# Patient Record
Sex: Male | Born: 1950 | Race: Black or African American | Hispanic: No | Marital: Single | Smoking: Current every day smoker
Health system: Southern US, Community
[De-identification: ages and names within clinical notes are randomized; demographics above are authoritative.]

## PROBLEM LIST (undated history)

## (undated) DIAGNOSIS — B019 Varicella without complication: Secondary | ICD-10-CM

## (undated) DIAGNOSIS — M1990 Unspecified osteoarthritis, unspecified site: Secondary | ICD-10-CM

## (undated) DIAGNOSIS — Q249 Congenital malformation of heart, unspecified: Secondary | ICD-10-CM

## (undated) DIAGNOSIS — I809 Phlebitis and thrombophlebitis of unspecified site: Secondary | ICD-10-CM

## (undated) HISTORY — DX: Unspecified osteoarthritis, unspecified site: M19.90

## (undated) HISTORY — PX: KNEE SURGERY: SHX244

## (undated) HISTORY — DX: Phlebitis and thrombophlebitis of unspecified site: I80.9

## (undated) HISTORY — DX: Congenital malformation of heart, unspecified: Q24.9

## (undated) HISTORY — DX: Varicella without complication: B01.9

---

## 2010-01-08 DIAGNOSIS — Z86718 Personal history of other venous thrombosis and embolism: Secondary | ICD-10-CM

## 2010-01-08 HISTORY — DX: Personal history of other venous thrombosis and embolism: Z86.718

## 2012-01-25 ENCOUNTER — Ambulatory Visit: Payer: Self-pay

## 2012-01-25 ENCOUNTER — Other Ambulatory Visit: Payer: Self-pay | Admitting: Occupational Medicine

## 2012-01-25 DIAGNOSIS — Z021 Encounter for pre-employment examination: Secondary | ICD-10-CM

## 2013-07-02 ENCOUNTER — Other Ambulatory Visit: Payer: Self-pay | Admitting: Family Medicine

## 2013-07-02 ENCOUNTER — Ambulatory Visit
Admission: RE | Admit: 2013-07-02 | Discharge: 2013-07-02 | Disposition: A | Discharge status: Home / Self Care | Payer: No Typology Code available for payment source | Source: Ambulatory Visit | Attending: Family Medicine | Admitting: Family Medicine

## 2013-07-02 ENCOUNTER — Encounter (INDEPENDENT_AMBULATORY_CARE_PROVIDER_SITE_OTHER): Payer: Self-pay

## 2013-07-02 DIAGNOSIS — Z0000 Encounter for general adult medical examination without abnormal findings: Secondary | ICD-10-CM

## 2014-08-18 ENCOUNTER — Other Ambulatory Visit: Payer: Self-pay | Admitting: Occupational Medicine

## 2014-08-18 ENCOUNTER — Ambulatory Visit: Payer: Self-pay

## 2014-08-18 DIAGNOSIS — Z0000 Encounter for general adult medical examination without abnormal findings: Secondary | ICD-10-CM

## 2015-11-25 ENCOUNTER — Other Ambulatory Visit: Payer: Self-pay | Admitting: Orthopedic Surgery

## 2015-11-25 DIAGNOSIS — S8001XD Contusion of right knee, subsequent encounter: Secondary | ICD-10-CM

## 2015-12-09 ENCOUNTER — Ambulatory Visit
Admission: RE | Admit: 2015-12-09 | Discharge: 2015-12-09 | Disposition: A | Discharge status: Home / Self Care | Payer: No Typology Code available for payment source | Source: Ambulatory Visit | Attending: Orthopedic Surgery | Admitting: Orthopedic Surgery

## 2015-12-09 DIAGNOSIS — S83241D Other tear of medial meniscus, current injury, right knee, subsequent encounter: Secondary | ICD-10-CM | POA: Insufficient documentation

## 2015-12-09 DIAGNOSIS — M948X6 Other specified disorders of cartilage, lower leg: Secondary | ICD-10-CM | POA: Diagnosis not present

## 2015-12-09 DIAGNOSIS — S8001XD Contusion of right knee, subsequent encounter: Secondary | ICD-10-CM | POA: Insufficient documentation

## 2015-12-09 DIAGNOSIS — M67961 Unspecified disorder of synovium and tendon, right lower leg: Secondary | ICD-10-CM | POA: Insufficient documentation

## 2015-12-09 DIAGNOSIS — X58XXXD Exposure to other specified factors, subsequent encounter: Secondary | ICD-10-CM | POA: Diagnosis not present

## 2019-06-05 ENCOUNTER — Other Ambulatory Visit: Payer: Self-pay

## 2019-06-09 ENCOUNTER — Other Ambulatory Visit: Payer: Self-pay

## 2019-06-09 ENCOUNTER — Encounter: Payer: Self-pay | Admitting: Nurse Practitioner

## 2019-06-09 ENCOUNTER — Emergency Department
Admission: EM | Admit: 2019-06-09 | Discharge: 2019-06-09 | Disposition: A | Discharge status: Home / Self Care | Payer: BC Managed Care – PPO | Attending: Emergency Medicine | Admitting: Emergency Medicine

## 2019-06-09 ENCOUNTER — Emergency Department: Payer: BC Managed Care – PPO

## 2019-06-09 ENCOUNTER — Telehealth: Payer: Self-pay | Admitting: Nurse Practitioner

## 2019-06-09 ENCOUNTER — Encounter: Payer: Self-pay | Admitting: Emergency Medicine

## 2019-06-09 ENCOUNTER — Ambulatory Visit (INDEPENDENT_AMBULATORY_CARE_PROVIDER_SITE_OTHER): Payer: BC Managed Care – PPO | Admitting: Nurse Practitioner

## 2019-06-09 VITALS — BP 160/90 | HR 64 | Temp 98.2°F | Ht 74.0 in | Wt 211.6 lb

## 2019-06-09 DIAGNOSIS — R42 Dizziness and giddiness: Secondary | ICD-10-CM

## 2019-06-09 DIAGNOSIS — I10 Essential (primary) hypertension: Secondary | ICD-10-CM

## 2019-06-09 DIAGNOSIS — R0789 Other chest pain: Secondary | ICD-10-CM | POA: Insufficient documentation

## 2019-06-09 DIAGNOSIS — H6121 Impacted cerumen, right ear: Secondary | ICD-10-CM | POA: Diagnosis not present

## 2019-06-09 DIAGNOSIS — F17210 Nicotine dependence, cigarettes, uncomplicated: Secondary | ICD-10-CM | POA: Diagnosis not present

## 2019-06-09 LAB — URINALYSIS, COMPLETE (UACMP) WITH MICROSCOPIC
Bacteria, UA: NONE SEEN
Bilirubin Urine: NEGATIVE
Glucose, UA: NEGATIVE mg/dL
Hgb urine dipstick: NEGATIVE
Ketones, ur: NEGATIVE mg/dL
Leukocytes,Ua: NEGATIVE
Nitrite: NEGATIVE
Protein, ur: NEGATIVE mg/dL
Specific Gravity, Urine: 1.021 (ref 1.005–1.030)
Squamous Epithelial / HPF: NONE SEEN (ref 0–5)
pH: 5 (ref 5.0–8.0)

## 2019-06-09 LAB — BASIC METABOLIC PANEL
Anion gap: 9 (ref 5–15)
BUN: 15 mg/dL (ref 8–23)
CO2: 24 mmol/L (ref 22–32)
Calcium: 9 mg/dL (ref 8.9–10.3)
Chloride: 109 mmol/L (ref 98–111)
Creatinine, Ser: 0.95 mg/dL (ref 0.61–1.24)
GFR calc Af Amer: >60 mL/min (ref >60)
GFR calc non Af Amer: >60 mL/min (ref >60)
Glucose, Bld: 111 mg/dL — ABNORMAL HIGH (ref 70–99)
Potassium: 3.7 mmol/L (ref 3.5–5.1)
Sodium: 142 mmol/L (ref 135–145)

## 2019-06-09 LAB — CBC
HCT: 44.8 % (ref 39.0–52.0)
Hemoglobin: 15 g/dL (ref 13.0–17.0)
MCH: 28.7 pg (ref 26.0–34.0)
MCHC: 33.5 g/dL (ref 30.0–36.0)
MCV: 85.8 fL (ref 80.0–100.0)
Platelets: 160 10*3/uL (ref 150–400)
RBC: 5.22 MIL/uL (ref 4.22–5.81)
RDW: 14.9 % (ref 11.5–15.5)
WBC: 6.1 10*3/uL (ref 4.0–10.5)
nRBC: 0 % (ref 0.0–0.2)

## 2019-06-09 LAB — TROPONIN I (HIGH SENSITIVITY): Troponin I (High Sensitivity): 8 ng/L (ref <18)

## 2019-06-09 MED ORDER — AMLODIPINE BESYLATE 5 MG PO TABS
5.0000 mg | ORAL_TABLET | Freq: Every day | ORAL | 2 refills | Status: DC
Start: 2019-06-09 — End: 2019-06-23

## 2019-06-09 NOTE — ED Notes (Signed)
E-signature not working at this time. Pt verbalized understanding of D/C instructions, prescriptions and follow up care with no further questions at this time. Pt in NAD and ambulatory at time of D/C.  

## 2019-06-09 NOTE — ED Triage Notes (Signed)
Patient reports recent dx of URI. Patient states 2 weeks ago he coughed really head and started feeling a pressure in his chest that has stayed intermittently since then. Patient also reports worsening dizziness and lightheadedness.

## 2019-06-09 NOTE — ED Notes (Signed)
Sent green and purple top tubes to lab. 

## 2019-06-09 NOTE — Progress Notes (Signed)
New Patient Office Visit  Subjective:  Patient ID: Steven Hill, male    DOB: Jan 05, 1951  Age: 69 y.o. MRN: [id number]  CC:  Chief Complaint  Patient presents with  . New Patient (Initial Visit)    establish care    HPI Steven Hill presents to establish with a primary care provider as recommended by [facility]. He has not been established with a primary care provider and has used [facility] as needed.  He was seen at [facility] for severe cough on 05/27/2019 and was prescribed Augmentin and azithromycin on 05/27/2019. CXR showed chronic chest findings.  His main concern today is feeling lightheaded, intermittent CP, and right ear  feels clogged and loss of hearing.  Lightheaded: Patient reports intermittent lightheaded sensation for about 4 years.  He says it happens during the warm weather.  It is starting to scare him now because it is becoming more frequent, and every day he has this. It can occur back to back for several minutes, and then stops.  This feels like he is going to faint. This is not a dizziness vertigo sensation.  This can occur randomly. He has had no syncope. It does not seem to be related to activity, position change, or exertion.  His right ear is clogged and he has had decreased hearing in his ear. He wonders if that could be causing his lightheadedness. He has had this sensation multiple times today. He was sitting and drinking coffee and felt faint  Chest pressure: Patient does get episodes of chest pressure that comes on randomly.  He last had this yesterday- came on while sitting, and lasted 15 seconds  Chest pain is twice daily. Non-exertional. Not associated with the lightheadedness. No  SOB except now, he feels the mask contributes to it.   He reports a history of arrhythmia/CAD:Heart arrhythmia- found on EKG's. Heart cath late 1990's- found 50-60% blockage and he did not tolerate NTG. He was advised to stop smoking and watch his diet.  DVT- 2012 following  knee surgery. He took warfarin for 1 year and no problems since.    Past Medical History:  Diagnosis Date  . Arthritis   . Cardiac arrhythmia due to congenital heart disease    Dx with arrhtymia in his 11's   . Chickenpox   . Phlebitis     Past Surgical History:  Procedure Laterality Date  . KNEE SURGERY Bilateral    Repair of meniscus right  x 2 and left knee meniscus repair.     Family History  Problem Relation Age of Onset  . Heart disease Mother   . Heart disease Father   . Heart disease Sister     Social History   Socioeconomic History  . Marital status: Single    Spouse name: Not on file  . Number of children: Not on file  . Years of education: Not on file  . Highest education level: Some college, no degree  Occupational History  . Occupation: Child psychotherapist  Tobacco Use  . Smoking status: Current Every Day Smoker    Packs/day: 1.00    Years: 50.00    Pack years: 50.00    Types: Cigarettes  . Smokeless tobacco: Never Used  Substance and Sexual Activity  . Alcohol use: Yes    Alcohol/week: 21.0 standard drinks    Types: 21 Shots of liquor per week  . Drug use: Never  . Sexual activity: Yes  Other Topics Concern  . Not  on file  Social History Narrative   Works full time. Lives alone. Divorced. Next of kin - [name] or son - [name]   Social Determinants of Health   Financial Resource Strain:   . Difficulty of Paying Living Expenses:   Food Insecurity:   . Worried About Programme researcher, broadcasting/film/video in the Last Year:   . Barista in the Last Year:   Transportation Needs:   . Freight forwarder (Medical):   Marland Kitchen Lack of Transportation (Non-Medical):   Physical Activity:   . Days of Exercise per Week:   . Minutes of Exercise per Session:   Stress:   . Feeling of Stress :   Social Connections:   . Frequency of Communication with Friends and Family:   . Frequency of Social Gatherings with Friends and Family:   . Attends Religious Services:   . Active  Member of Clubs or Organizations:   . Attends Banker Meetings:   Marland Kitchen Marital Status:   Intimate Partner Violence:   . Fear of Current or Ex-Partner:   . Emotionally Abused:   Marland Kitchen Physically Abused:   . Sexually Abused:     ROS Review of Systems  Constitutional: Negative for chills, diaphoresis, fatigue and fever.  HENT: Negative for congestion and sinus pressure.        Right ear blocked with wax and decreased hearing. He used OTC ear wax removal- no help.   Respiratory: Positive for chest tightness. Negative for cough and shortness of breath.   Cardiovascular: Positive for palpitations.  Gastrointestinal: Negative for abdominal pain, blood in stool, constipation, diarrhea, nausea and vomiting.  Endocrine: Negative.   Genitourinary: Negative for difficulty urinating.  Musculoskeletal: Positive for arthralgias.  Skin: Negative for rash.  Allergic/Immunologic: Negative.   Neurological: Positive for light-headedness.  Hematological: Negative for adenopathy. Does not bruise/bleed easily.  Psychiatric/Behavioral:       No concerns with depression/anxiety.     Objective:   Today's Vitals: BP (!) 160/90 (BP Location: Left Arm, Patient Position: Sitting, Cuff Size: Normal)   Pulse 64   Temp 98.2 F (36.8 C) (Skin)   Ht 6\' 2"  (1.88 m)   Wt 211 lb 9.6 oz (96 kg)   SpO2 98%   BMI 27.17 kg/m   Physical Exam Vitals reviewed.  Constitutional:      Appearance: Normal appearance.  HENT:     Head: Normocephalic and atraumatic.     Right Ear: There is impacted cerumen.     Left Ear: Tympanic membrane and ear canal normal. There is no impacted cerumen.  Eyes:     Pupils: Pupils are equal, round, and reactive to light.     Comments: Sclera reddened- reports chronic.   Neck:     Vascular: No carotid bruit.  Cardiovascular:     Rate and Rhythm: Normal rate and regular rhythm.     Pulses: Normal pulses.     Heart sounds: Normal heart sounds.  Pulmonary:     Effort:  Pulmonary effort is normal.     Breath sounds: Normal breath sounds. No wheezing or rhonchi.  Abdominal:     General: Abdomen is flat.     Palpations: Abdomen is soft.     Tenderness: There is no abdominal tenderness.  Musculoskeletal:        General: Normal range of motion.     Cervical back: Normal range of motion and neck supple.     Right lower leg: No edema.  Left lower leg: No edema.  Lymphadenopathy:     Cervical: No cervical adenopathy.  Skin:    General: Skin is warm and dry.  Neurological:     General: No focal deficit present.     Mental Status: He is alert and oriented to person, place, and time.     Sensory: No sensory deficit.     Motor: No weakness.     Coordination: Coordination normal.     Gait: Gait normal.     Deep Tendon Reflexes: Reflexes normal.  Psychiatric:        Mood and Affect: Mood normal.        Behavior: Behavior normal.        Thought Content: Thought content normal.        Judgment: Judgment normal.     Assessment & Plan:   Problem List Items Addressed This Visit      Cardiovascular and Mediastinum   Hypertension   Relevant Orders   EKG 12-Lead     Other   Lightheadedness - Primary   Relevant Orders   EKG 12-Lead   Chest pressure      Outpatient Encounter Medications as of 06/09/2019  Medication Sig  . meloxicam (MOBIC) 15 MG tablet Take 15 mg by mouth daily.  . [DISCONTINUED] enoxaparin (LOVENOX) 100 MG/ML injection Inject into the skin.  . [DISCONTINUED] acetaminophen (TYLENOL) 325 MG tablet Take by mouth.  . [DISCONTINUED] cyclobenzaprine (FLEXERIL) 10 MG tablet every 12 (twelve) hours.   No facility-administered encounter medications on file as of 06/09/2019.  EKG shows sinus bradycardia at 59, blood pressure 1.  T wave changes concerning for ischemia.  Case discussed with Dr. Nicki Reaper in collaboration of care.  Patient was advised to go to the emergency department for further evaluation of the EKG.  He was further advised not to  drive himself.  Patient reports there is nobody here but himself.  He declined EMS transfer.  Patient states that he accepts all responsibility for driving himself to the hospital with his current symptoms. He is aware that if he faints, he could cause an accident and injure himself or others.   Patient states he feels fine, currently has no lightheaded symptoms, no chest pressure, or shortness of breath.  Follow-up: No follow-ups on file.   Denice Paradise, NP

## 2019-06-09 NOTE — Telephone Encounter (Signed)
He was seen on the ED for HTN, abnormal EKG- with negative troponin, and lightheadedness. He had his right ear cleaned out. Hearing better. He was placed on Norvasc 5 mg daily. CBC, Bmet- WNL.   PLAN:  Office visit with me in 2 weeks to check BP.  Cardiac consult for Hx of self reported CAD, lightheadedness, and  intermittent CP with abnormal EKG.

## 2019-06-09 NOTE — ED Provider Notes (Signed)
[facility] Emergency Department Provider Note  Time seen: 2:35 PM  I have reviewed the triage vital signs and the nursing notes.   HISTORY  Chief Complaint Dizziness   HPI Steven Hill is a 69 y.o. male with a past medical history of arthritis, presents to the emergency department for intermittent dizziness and chest pain.  According to the patient approximately 2 weeks ago he developed chest pain in the left chest.  States it lasted several hours then went away.   Denies any chest pain since then but states over the past several months or years he has been experiencing intermittent episodes of dizziness and lightheadedness.  Patient says he went to urgent care to be evaluated they said due to his EKG he needs to be go see his doctor.  He went to his doctor and was told to come to the emergency department.  Patient denies any chest pain denies any dyspnea is currently but states at times he will feel lightheaded.  Patient states he has been told in the past he has high blood pressure but is not prescribed any medications for this.  Denies any weakness or numbness.  Past Medical History:  Diagnosis Date  . Arthritis   . Cardiac arrhythmia due to congenital heart disease    Dx with arrhtymia in his 64's   . Chickenpox   . Phlebitis     Patient Active Problem List   Diagnosis Date Noted  . Lightheadedness 06/09/2019  . Hypertension 06/09/2019  . Chest pressure 06/09/2019    Past Surgical History:  Procedure Laterality Date  . KNEE SURGERY Bilateral    Repair of meniscus right  x 2 and left knee meniscus repair.     Prior to Admission medications   Medication Sig Start Date End Date Taking? Authorizing Provider  meloxicam (MOBIC) 15 MG tablet Take 15 mg by mouth daily. 05/19/19   [provider]    No Known Allergies  Family History  Problem Relation Age of Onset  . Heart disease Mother   . Heart disease Father   . Heart disease Sister      Social History Social History   Tobacco Use  . Smoking status: Current Every Day Smoker    Packs/day: 1.00    Years: 50.00    Pack years: 50.00    Types: Cigarettes  . Smokeless tobacco: Never Used  Substance Use Topics  . Alcohol use: Yes    Alcohol/week: 21.0 standard drinks    Types: 21 Shots of liquor per week  . Drug use: Never    Review of Systems Constitutional: Negative for fever.  Intermittent dizziness. Cardiovascular: Chest pain 2 weeks ago when coughing but none since. Respiratory: Negative for shortness of breath. Gastrointestinal: Negative for abdominal pain, vomiting and diarrhea. Genitourinary: Negative for urinary compaints Musculoskeletal: Negative for musculoskeletal complaints Skin: Negative for skin complaints  Neurological: Negative for headache All other ROS negative  ____________________________________________   PHYSICAL EXAM:  VITAL SIGNS: ED Triage Vitals  Enc Vitals Group     BP 06/09/19 1139 (!) 171/86     Pulse Rate 06/09/19 1139 65     Resp 06/09/19 1139 18     Temp 06/09/19 1139 98.5 F (36.9 C)     Temp Source 06/09/19 1139 Oral     SpO2 06/09/19 1139 98 %     Weight 06/09/19 1145 211 lb 6.7 oz (95.9 kg)     Height 06/09/19 1145 6\' 2"  (1.88 m)  Head Circumference --      Peak Flow --      Pain Score 06/09/19 1145 0     Pain Loc --      Pain Edu? --      Excl. in GC? --     Constitutional: Alert and oriented. Well appearing and in no distress. Eyes: Normal exam ENT      Head: Normocephalic and atraumatic.  Right sided cerumen impaction.      Mouth/Throat: Mucous membranes are moist. Cardiovascular: Normal rate, regular rhythm.  Respiratory: Normal respiratory effort without tachypnea nor retractions. Breath sounds are clear  Gastrointestinal: Soft and nontender. No distention.   Musculoskeletal: Nontender with normal range of motion in all extremities.  Neurologic:  Normal speech and language. No gross focal  neurologic deficits  Skin:  Skin is warm, dry and intact.  Psychiatric: Mood and affect are normal.  ____________________________________________    EKG  EKG viewed and interpreted by myself shows a normal sinus rhythm at 68 bpm with a slightly widened QRS, normal axis, largely normal intervals, nonspecific ST changes.  Morphology most consistent with right bundle branch block.  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  No acute abnormality Chest x-ray negative  ____________________________________________   INITIAL IMPRESSION / ASSESSMENT AND PLAN / ED COURSE  Pertinent labs & imaging results that were available during my care of the patient were reviewed by me and considered in my medical decision making (see chart for details).   Patient presents to the emergency department for chest pain 2 weeks ago and intermittent dizziness over the last few months.  Overall patient appears well with a reassuring physical exam.  As a secondary complaint also states he cannot hear out of his right ear has been told he has a wax impaction per patient.  I was able to remove a good amount of cerumen with a curette, the patient states his hearing is improved dramatically as this could have been contributing to his intermittent dizziness.  Patient is also quite hypertensive has been told he has high blood pressure in the past but is not taking any medications.  Try to establish care at [facility] today but they sent him to the emergency department.  Patient will follow up with [facility].  We will start the patient on a antihypertensive medication.  I also discussed the CT findings of an old infarct, patient was unaware of this but will follow up with his primary care doctor.  Patient's work-up in the emergency department is reassuring including a negative troponin.  Steven Hill was evaluated in Emergency Department on 06/09/2019 for the  symptoms described in the history of present illness. He was evaluated in the context of the global COVID-19 pandemic, which necessitated consideration that the patient might be at risk for infection with the SARS-CoV-2 virus that causes COVID-19. Institutional protocols and algorithms that pertain to the evaluation of patients at risk for COVID-19 are in a state of rapid change based on information released by regulatory bodies including the CDC and federal and state organizations. These policies and algorithms were followed during the patient's care in the ED.  Ceruminosis is noted.  Wax is removed by manual debridement. Instructions for home care to prevent wax buildup are given.   ____________________________________________   FINAL CLINICAL IMPRESSION(S) / ED DIAGNOSES  Chest pain Hypertension Dizziness Cerumen impaction   Minna Antis, MD 06/09/19 1440

## 2019-06-10 NOTE — Telephone Encounter (Signed)
Patient scheduled for 06/22/2019 at 9:30am

## 2019-06-17 ENCOUNTER — Telehealth: Payer: Self-pay

## 2019-06-17 NOTE — Telephone Encounter (Signed)
[email or web address]

## 2019-06-17 NOTE — Telephone Encounter (Signed)
Please call him for more info.   What dates is he looking for ST disability?  He was out of work from when to when? Why was he unable to work?  Has he started back? Is he doing full duty?

## 2019-06-17 NOTE — Telephone Encounter (Signed)
Received STD forms to be filled out from [facility]. Placed in quick sign folder and will fax when finished

## 2019-06-19 ENCOUNTER — Other Ambulatory Visit: Payer: Self-pay

## 2019-06-22 ENCOUNTER — Other Ambulatory Visit: Payer: Self-pay

## 2019-06-22 ENCOUNTER — Encounter: Payer: Self-pay | Admitting: Nurse Practitioner

## 2019-06-22 ENCOUNTER — Ambulatory Visit (INDEPENDENT_AMBULATORY_CARE_PROVIDER_SITE_OTHER): Payer: BC Managed Care – PPO | Admitting: Nurse Practitioner

## 2019-06-22 VITALS — BP 170/98 | HR 72 | Temp 98.0°F | Ht 74.0 in | Wt 211.8 lb

## 2019-06-22 DIAGNOSIS — H6123 Impacted cerumen, bilateral: Secondary | ICD-10-CM

## 2019-06-22 DIAGNOSIS — I10 Essential (primary) hypertension: Secondary | ICD-10-CM | POA: Diagnosis not present

## 2019-06-22 DIAGNOSIS — R42 Dizziness and giddiness: Secondary | ICD-10-CM | POA: Diagnosis not present

## 2019-06-22 DIAGNOSIS — Z1159 Encounter for screening for other viral diseases: Secondary | ICD-10-CM

## 2019-06-22 DIAGNOSIS — R930 Abnormal findings on diagnostic imaging of skull and head, not elsewhere classified: Secondary | ICD-10-CM | POA: Diagnosis not present

## 2019-06-22 DIAGNOSIS — Z789 Other specified health status: Secondary | ICD-10-CM

## 2019-06-22 DIAGNOSIS — Z125 Encounter for screening for malignant neoplasm of prostate: Secondary | ICD-10-CM

## 2019-06-22 LAB — LIPID PANEL
Cholesterol: 168 mg/dL (ref 0–200)
HDL: 64.5 mg/dL (ref >39.00)
LDL Cholesterol: 94 mg/dL (ref 0–99)
NonHDL: 103.78
Total CHOL/HDL Ratio: 3
Triglycerides: 47 mg/dL (ref 0.0–149.0)
VLDL: 9.4 mg/dL (ref 0.0–40.0)

## 2019-06-22 LAB — MICROALBUMIN / CREATININE URINE RATIO
Creatinine,U: 107.2 mg/dL
Microalb Creat Ratio: 0.7 mg/g (ref 0.0–30.0)
Microalb, Ur: <0.7 mg/dL (ref 0.0–1.9)

## 2019-06-22 LAB — HEMOGLOBIN: Hemoglobin: 15.3 g/dL (ref 13.0–17.0)

## 2019-06-22 LAB — TSH: TSH: 0.64 u[IU]/mL (ref 0.35–4.50)

## 2019-06-22 LAB — COMPREHENSIVE METABOLIC PANEL
ALT: 12 U/L (ref 0–53)
AST: 14 U/L (ref 0–37)
Albumin: 4.4 g/dL (ref 3.5–5.2)
Alkaline Phosphatase: 76 U/L (ref 39–117)
BUN: 12 mg/dL (ref 6–23)
CO2: 25 mEq/L (ref 19–32)
Calcium: 9.2 mg/dL (ref 8.4–10.5)
Chloride: 108 mEq/L (ref 96–112)
Creatinine, Ser: 0.94 mg/dL (ref 0.40–1.50)
GFR: 96.79 mL/min (ref >60.00)
Glucose, Bld: 105 mg/dL — ABNORMAL HIGH (ref 70–99)
Potassium: 3.8 mEq/L (ref 3.5–5.1)
Sodium: 141 mEq/L (ref 135–145)
Total Bilirubin: 0.5 mg/dL (ref 0.2–1.2)
Total Protein: 6.7 g/dL (ref 6.0–8.3)

## 2019-06-22 LAB — HEMOGLOBIN A1C: Hgb A1c MFr Bld: 6.3 % (ref 4.6–6.5)

## 2019-06-22 LAB — PSA: PSA: 0.82 ng/mL (ref 0.10–4.00)

## 2019-06-22 MED ORDER — LOSARTAN POTASSIUM 50 MG PO TABS: 50.0000 mg | ORAL_TABLET | Freq: Every day | ORAL | 3 refills | Status: DC

## 2019-06-22 NOTE — Progress Notes (Signed)
Established Patient Office Visit  Subjective:  Patient ID: Steven Hill, male    DOB: Jul 28, 1950  Age: 69 y.o. MRN: [id number]  CC:  Chief Complaint  Patient presents with  . Follow-up    dizziness and hypertension    HPI Steven Hill is a 69 yo male with uncontrolled hypertension, intermittent chest pressure, dizziness/ lightheadedness, congenital heart arrhythmia and reports prior CAD 1990's, remote DVT. He returns for a 2 week f/up for dizziness and HTN.  Patient states he has been out of work since 05/26/2019 when he was seen at [facility] for cough and was prescribed Augmentin and azithromycin. He established care with [place] on 06/09/2019.  He was sent to the emergency room after seeing [place] on 06/09/2019.  Patient presents today for follow-up after ER visit.  He reports that he is waiting to be cleared to go back to work.   06/09/2019: Established care for main complaint of feeling lightheaded, having intermittent chest pressure, right ear is clogged and loss of hearing. Reports lightheaded sensation has been intermittent for 4 years, but is becoming more frequent and daily.  He feels like he is going to faint.  He does not feel like the room is spinning.  He has a sensation when he is just sitting as well as standing. BP 160/90, HR 64. EKG HR 59, ST -T wave abnormal.  He presented on no BP medication.  06/09/2019 ED visit: He had abnormal EKG with negative troponin.  He had his ear cleaned out and it was thought that would help his dizziness.  He was started on Norvasc 5 mg daily.  I ordered cardiac consultation.  06/09/2019: CT head WO contrast: No acute intracranial process.  Remote right caudate infarct. 06/09/2019: CXR 2 view: No evidence of acute cardiopulmonary abnormality.  6//14/2021:  HTN- Uncontrolled: Dx in 2012 and not on medication until 06/10/2019.  He started taking the amlodipine 5 mg daily after the ED visit.  He said the medication makes him extremely dizzy and he has to lay down for 2  hours after taking it.  Once he gets up after that, he feels okay.  He has been taking it every morning.  He does not check his blood pressure at home and is not sure if it is helping his blood pressure.  He canceled his cardiac consult.  He did not think he needed it and waited until this f/up appt to discuss.  .   BP Readings from Last 3 Encounters:  06/22/19 (!) 170/98  06/09/19 (!) 183/94  06/09/19 (!) 160/90    Lightheadedness: He continues to feel lightheaded like he might pass out.  This occurs daily this is intermittent, occurring daily, he has identified no triggers.  It can happen when he sitting, standing, and he has had no falls or syncope.  He has noted not heart palpitations, skipped beats, or chest pain.  Orthostatic vital signs obtained today are negative.  He does note that when he started amlodipine, that definitely triggered lightheadedness to the point where he had to go lay down and stay in bed for 2 hours before he was able to get back up and function.  Nicotine dependence: He is smoking 1 pack/day.  Alcohol dependence: He has cut back on his alcohol use.  He is currently having 3 shots of vodka daily.     Past Medical History:  Diagnosis Date  . Arthritis   . 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 Chickenpox   . History of DVT of lower extremity 2012   following knee surgery. Took warfarin for 1 year.   . Phlebitis     Past Surgical History:  Procedure Laterality Date  . KNEE SURGERY Bilateral    Repair of meniscus right  x 2 and left knee meniscus repair.     Family History  Problem Relation Age of Onset  . Heart disease Mother   . Heart disease Father   . Heart disease Sister     Social History   Socioeconomic History  . Marital status: Single    Spouse name: Not on file  . Number of children: Not on file  . Years of education: Not on file  . Highest education level: Some college, no degree    Occupational History  . Occupation: Diplomatic Services operational officer  Tobacco Use  . Smoking status: Current Every Day Smoker    Packs/day: 1.00    Years: 50.00    Pack years: 50.00    Types: Cigarettes  . Smokeless tobacco: Never Used  Vaping Use  . Vaping Use: Never used  Substance and Sexual Activity  . Alcohol use: Yes    Alcohol/week: 21.0 standard drinks    Types: 21 Shots of liquor per week  . Drug use: Never  . Sexual activity: Yes  Other Topics Concern  . Not on file  Social History Narrative   Works full time. Lives alone. Divorced. Next of kin - Leane Para or son - Marylyn Ishihara   Social Determinants of Health   Financial Resource Strain:   . Difficulty of Paying Living Expenses:   Food Insecurity:   . Worried About Charity fundraiser in the Last Year:   . Arboriculturist in the Last Year:   Transportation Needs:   . Film/video editor (Medical):   Marland Kitchen Lack of Transportation (Non-Medical):   Physical Activity:   . Days of Exercise per Week:   . Minutes of Exercise per Session:   Stress:   . Feeling of Stress :   Social Connections:   . Frequency of Communication with Friends and Family:   . Frequency of Social Gatherings with Friends and Family:   . Attends Religious Services:   . Active Member of Clubs or Organizations:   . Attends Archivist Meetings:   Marland Kitchen Marital Status:   Intimate Partner Violence:   . Fear of Current or Ex-Partner:   . Emotionally Abused:   Marland Kitchen Physically Abused:   . Sexually Abused:     Outpatient Medications Prior to Visit  Medication Sig Dispense Refill  . meloxicam (MOBIC) 15 MG tablet Take 15 mg by mouth daily.    Marland Kitchen amLODipine (NORVASC) 5 MG tablet Take 1 tablet (5 mg total) by mouth daily. 30 tablet 2   No facility-administered medications prior to visit.    No Known Allergies  ROS Review of Systems  Constitutional: Negative for chills, fever and unexpected weight change.  HENT: Negative for congestion and tinnitus.        Right  ear clogged.   Eyes: Negative.   Respiratory: Negative for cough, shortness of breath and stridor.   Cardiovascular: Negative for chest pain, palpitations and leg swelling.       He denies any CP now.   Gastrointestinal: Negative.   Genitourinary: Negative.   Musculoskeletal: Negative.   Allergic/Immunologic: Negative for environmental allergies.  Neurological: Positive for dizziness and light-headedness. Negative  for tremors, seizures, syncope, facial asymmetry, speech difficulty, weakness, numbness and headaches.  Hematological: Negative.   Psychiatric/Behavioral:       No concerns about depression/anxiety.       Objective:    Physical Exam Vitals reviewed.  Constitutional:      Appearance: Normal appearance.  HENT:     Head: Normocephalic and atraumatic.     Right Ear: There is impacted cerumen.     Left Ear: There is no impacted cerumen.  Eyes:     Extraocular Movements: Extraocular movements intact.     Conjunctiva/sclera: Conjunctivae normal.     Pupils: Pupils are equal, round, and reactive to light.     Comments: No nystagmus  Neck:     Vascular: No carotid bruit.  Cardiovascular:     Rate and Rhythm: Normal rate and regular rhythm.     Pulses: Normal pulses.     Heart sounds: Normal heart sounds. No murmur heard.   Pulmonary:     Effort: Pulmonary effort is normal.     Breath sounds: Normal breath sounds.  Abdominal:     Palpations: Abdomen is soft.     Tenderness: There is no abdominal tenderness.  Musculoskeletal:     Cervical back: Normal range of motion and neck supple.     Right lower leg: No edema.     Left lower leg: No edema.  Skin:    General: Skin is warm and dry.  Neurological:     General: No focal deficit present.     Mental Status: He is alert and oriented to person, place, and time. Mental status is at baseline.     Cranial Nerves: No cranial nerve deficit.     Sensory: No sensory deficit.     Motor: No weakness.     Coordination:  Coordination normal.     Gait: Gait normal.     Deep Tendon Reflexes: Reflexes normal.     Comments: No dizziness with turning head side to side or position changes unless he bends to touch his toes and comes upright.  He reports sitting in a chair he feels lightheaded.  Pulse is regular, blood pressure is systolic 016'W.   Psychiatric:        Mood and Affect: Mood normal.        Behavior: Behavior normal.        Thought Content: Thought content normal.        Judgment: Judgment normal.     BP (!) 170/98 (BP Location: Left Arm, Patient Position: Sitting, Cuff Size: Normal)   Pulse 72   Temp 98 F (36.7 C) (Skin)   Ht 6' 2"  (1.88 m)   Wt 211 lb 12.8 oz (96.1 kg)   SpO2 98%   BMI 27.19 kg/m  Wt Readings from Last 3 Encounters:  06/22/19 211 lb 12.8 oz (96.1 kg)  06/09/19 211 lb 6.7 oz (95.9 kg)  06/09/19 211 lb 9.6 oz (96 kg)     Health Maintenance Due  Topic Date Due  . TETANUS/TDAP  Never done  . COLONOSCOPY  Never done  . PNA vac Low Risk Adult (1 of 2 - PCV13) Never done    There are no preventive care reminders to display for this patient.  Lab Results  Component Value Date   TSH 0.64 06/22/2019   Lab Results  Component Value Date   WBC 6.1 06/09/2019   HGB 15.3 06/22/2019   HCT 44.8 06/09/2019   MCV 85.8 06/09/2019  PLT 160 06/09/2019   Lab Results  Component Value Date   NA 141 06/22/2019   K 3.8 06/22/2019   CO2 25 06/22/2019   GLUCOSE 105 (H) 06/22/2019   BUN 12 06/22/2019   CREATININE 0.94 06/22/2019   BILITOT 0.5 06/22/2019   ALKPHOS 76 06/22/2019   AST 14 06/22/2019   ALT 12 06/22/2019   PROT 6.7 06/22/2019   ALBUMIN 4.4 06/22/2019   CALCIUM 9.2 06/22/2019   ANIONGAP 9 06/09/2019   GFR 96.79 06/22/2019   CLINICAL DATA:  Chest pressure. Additional provided: Patient reports recent diagnosis of URI, worsening dizziness and lightheadedness, current smoker, history of congenital heart disease  EXAM: CHEST - 2 VIEW  COMPARISON:  Chest  radiograph 08/18/2014  FINDINGS: Heart size within normal limits.  There is no appreciable airspace consolidation.  No evidence of pleural effusion or pneumothorax.  No acute bony abnormality identified.  IMPRESSION: No evidence of acute cardiopulmonary abnormality.   Electronically Signed   By: Kellie Simmering DO   On: 06/09/2019 12:24  CLINICAL DATA:  Dizziness.  EXAM: CT HEAD WITHOUT CONTRAST  TECHNIQUE: Contiguous axial images were obtained from the base of the skull through the vertex without intravenous contrast.  COMPARISON:  None.  FINDINGS: Brain: Sequela of remote right caudate infarct. No acute infarct. No mass lesion. No midline shift, ventriculomegaly or extra-axial fluid collection.  Vascular: No hyperdense vessel. Bilateral carotid siphon atherosclerotic calcifications.  Skull: Negative for fracture or focal lesion.  Sinuses/Orbits: Normal orbits. Clear paranasal sinuses. No mastoid effusion.  Other: None.  IMPRESSION: No acute intracranial process.  Remote right caudate infarct.   Electronically Signed   By: Primitivo Gauze M.D.   On: 06/09/2019 12:13    Assessment & Plan:   Problem List Items Addressed This Visit      Cardiovascular and Mediastinum   Hypertension   Relevant Medications   losartan (COZAAR) 50 MG tablet   Other Relevant Orders   Comp Met (CMET) (Completed)   Lipid Profile (Completed)   HgB A1c (Completed)   TSH (Completed)   Microalbumin / creatinine urine ratio (Completed)   Ambulatory referral to Cardiology     Nervous and Auditory   Bilateral impacted cerumen   Relevant Orders   Ambulatory referral to ENT     Other   Lightheadedness   Relevant Orders   Ambulatory referral to Neurology   Hemoglobin (Completed)   Ambulatory referral to Cardiology   Abnormal CT of the head - Primary   Relevant Orders   Ambulatory referral to Neurology   Need for hepatitis C screening test    Relevant Orders   HCV Ab w Reflex to Quant PCR (Completed)   Screening for malignant neoplasm of prostate   Relevant Orders   PSA (Completed)   Daily consumption of alcohol      Meds ordered this encounter  Medications  . losartan (COZAAR) 50 MG tablet    Sig: Take 1 tablet (50 mg total) by mouth daily.    Dispense:  90 tablet    Refill:  3    Order Specific Question:   Supervising Provider    Answer:   Einar Pheasant [phone]   Please STOP the amlodipine blood pressure medicines as you do not seem to be tolerating this.   I have ordered a new blood pressure medication for you called losartan.  Take that tablet 1 daily.    We do need to have you back in the office in 2 weeks to  check your blood work to make sure that your kidney function remains normal and to check your blood pressure and to see how you are getting along.  Uncontrolled hypertension may be contributing to lightheadedness.  I have put in a referral to ENT to clean out your right ear, and slight wax in the left ear. His ear has been cleaned out several times- without success. This may be causing the lightheadedness.   I put a referral to neurology to go over the head CT findings of old stroke.  Would like neurology opinion on chronic lightheadedness as well.  Addendum: I had to replace the Cardiology referral as it was closed. I ordered an urgent referral for local Cardiologist.   The patient is requesting to remain out of work while we work-up his uncontrolled hypertension, lightheadedness, and cerumen impaction.  He does not feel safe to operate machinery as he feels lightheaded.  Physical exam in the office was unremarkable.  Orthostatic vital signs were normal.  Blood pressure is elevated and I think we can find the right blood pressure medication  for him.  He was advised to cut back on alcohol to 1 shot per day, and eventually one half shot per day.  We discussed abrupt discontinuation of alcohol when he is used to  drinking every day may put him at risk for seizure disorder.  He voices understanding and agrees.    Office visit in 2 weeks.   Follow-up: Return in about 2 weeks (around 07/06/2019).   This visit occurred during the SARS-CoV-2 public health emergency.  Safety protocols were in place, including screening questions prior to the visit, additional usage of staff PPE, and extensive cleaning of exam room while observing appropriate contact time as indicated for disinfecting solutions.   Denice Paradise, NP

## 2019-06-22 NOTE — Telephone Encounter (Signed)
Patient being seen today. Patient states he has been out of work since 05/26/19 when he was seen at [facility] for dizziness and hypertension; saw [place] on 06/09/19 and then again today. Patient was sent to ER after seeing [place] on 06/09/19 as well. Patient is waiting to be cleared to go back to work.

## 2019-06-22 NOTE — Patient Instructions (Addendum)
Please STOP the amlodipine blood pressure medicines as you do not seem to be tolerating this.   I have ordered a new blood pressure medication for you called losartan.  Take that tablet 1 daily.    We do need to have you back in the office in 2 weeks to check your blood work to make sure that your kidney function remains normal and to check your blood pressure and to see how you are getting along.  I have put in a referral to ENT to clean out your right ear, and slight wax in the left ear.  I put a referral to neurology to go over the head CT findings of old stroke.  I have already placed a referral in to cardiology for you. Please call and set up an appt.   Office visit in 2 weeks.   Dizziness Dizziness is a common problem. It is a feeling of unsteadiness or light-headedness. You may feel like you are about to faint. Dizziness can lead to injury if you stumble or fall. Anyone can become dizzy, but dizziness is more common in older adults. This condition can be caused by a number of things, including medicines, dehydration, or illness. Follow these instructions at home: Eating and drinking  Drink enough fluid to keep your urine clear or pale yellow. This helps to keep you from becoming dehydrated. Try to drink more clear fluids, such as water.  Do not drink alcohol.  Limit your caffeine intake if told to do so by your health care provider. Check ingredients and nutrition facts to see if a food or beverage contains caffeine.  Limit your salt (sodium) intake if told to do so by your health care provider. Check ingredients and nutrition facts to see if a food or beverage contains sodium. Activity  Avoid making quick movements. ? Rise slowly from chairs and steady yourself until you feel okay. ? In the morning, first sit up on the side of the bed. When you feel okay, stand slowly while you hold onto something until you know that your balance is fine.  If you need to stand in one place for  a long time, move your legs often. Tighten and relax the muscles in your legs while you are standing.  Do not drive or use heavy machinery if you feel dizzy.  Avoid bending down if you feel dizzy. Place items in your home so that they are easy for you to reach without leaning over. Lifestyle  Do not use any products that contain nicotine or tobacco, such as cigarettes and e-cigarettes. If you need help quitting, ask your health care provider.  Try to reduce your stress level by using methods such as yoga or meditation. Talk with your health care provider if you need help to manage your stress. General instructions  Watch your dizziness for any changes.  Take over-the-counter and prescription medicines only as told by your health care provider. Talk with your health care provider if you think that your dizziness is caused by a medicine that you are taking.  Tell a friend or a family member that you are feeling dizzy. If he or she notices any changes in your behavior, have this person call your health care provider.  Keep all follow-up visits as told by your health care provider. This is important. Contact a health care provider if:  Your dizziness does not go away.  Your dizziness or light-headedness gets worse.  You feel nauseous.  You have reduced hearing.  You have new symptoms.  You are unsteady on your feet or you feel like the room is spinning. Get help right away if:  You vomit or have diarrhea and are unable to eat or drink anything.  You have problems talking, walking, swallowing, or using your arms, hands, or legs.  You feel generally weak.  You are not thinking clearly or you have trouble forming sentences. It may take a friend or family member to notice this.  You have chest pain, abdominal pain, shortness of breath, or sweating.  Your vision changes.  You have any bleeding.  You have a severe headache.  You have neck pain or a stiff neck.  You have a  fever. These symptoms may represent a serious problem that is an emergency. Do not wait to see if the symptoms will go away. Get medical help right away. Call your local emergency services (911 in the [place] Do not drive yourself to the hospital. Summary  Dizziness is a feeling of unsteadiness or light-headedness. This condition can be caused by a number of things, including medicines, dehydration, or illness.  Anyone can become dizzy, but dizziness is more common in older adults.  Drink enough fluid to keep your urine clear or pale yellow. Do not drink alcohol.  Avoid making quick movements if you feel dizzy. Monitor your dizziness for any changes. This information is not intended to replace advice given to you by your health care provider. Make sure you discuss any questions you have with your health care provider. Document Revised: 12/28/2016 Document Reviewed: 01/28/2016 Elsevier Patient Education  2020 [employer]  Earwax Buildup, Adult The ears produce a substance called earwax that helps keep bacteria out of the ear and protects the skin in the ear canal. Occasionally, earwax can build up in the ear and cause discomfort or hearing loss. What increases the risk? This condition is more likely to develop in people who:  Are male.  Are elderly.  Naturally produce more earwax.  Clean their ears often with cotton swabs.  Use earplugs often.  Use in-ear headphones often.  Wear hearing aids.  Have narrow ear canals.  Have earwax that is overly thick or sticky.  Have eczema.  Are dehydrated.  Have excess hair in the ear canal. What are the signs or symptoms? Symptoms of this condition include:  Reduced or muffled hearing.  A feeling of fullness in the ear or feeling that the ear is plugged.  Fluid coming from the ear.  Ear pain.  Ear itch.  Ringing in the ear.  Coughing.  An obvious piece of earwax that can be seen inside the ear canal. How is this  diagnosed? This condition may be diagnosed based on:  Your symptoms.  Your medical history.  An ear exam. During the exam, your health care provider will look into your ear with an instrument called an otoscope. You may have tests, including a hearing test. How is this treated? This condition may be treated by:  Using ear drops to soften the earwax.  Having the earwax removed by a health care provider. The health care provider may: ? Flush the ear with water. ? Use an instrument that has a loop on the end (curette). ? Use a suction device.  Surgery to remove the wax buildup. This may be done in severe cases. Follow these instructions at home:   Take over-the-counter and prescription medicines only as told by your health care provider.  Do not put any objects,  including cotton swabs, into your ear. You can clean the opening of your ear canal with a washcloth or facial tissue.  Follow instructions from your health care provider about cleaning your ears. Do not over-clean your ears.  Drink enough fluid to keep your urine clear or pale yellow. This will help to thin the earwax.  Keep all follow-up visits as told by your health care provider. If earwax builds up in your ears often or if you use hearing aids, consider seeing your health care provider for routine, preventive ear cleanings. Ask your health care provider how often you should schedule your cleanings.  If you have hearing aids, clean them according to instructions from the manufacturer and your health care provider. Contact a health care provider if:  You have ear pain.  You develop a fever.  You have blood, pus, or other fluid coming from your ear.  You have hearing loss.  You have ringing in your ears that does not go away.  Your symptoms do not improve with treatment.  You feel like the room is spinning (vertigo). Summary  Earwax can build up in the ear and cause discomfort or hearing loss.  The most  common symptoms of this condition include reduced or muffled hearing and a feeling of fullness in the ear or feeling that the ear is plugged.  This condition may be diagnosed based on your symptoms, your medical history, and an ear exam.  This condition may be treated by using ear drops to soften the earwax or by having the earwax removed by a health care provider.  Do not put any objects, including cotton swabs, into your ear. You can clean the opening of your ear canal with a washcloth or facial tissue. This information is not intended to replace advice given to you by your health care provider. Make sure you discuss any questions you have with your health care provider. Document Revised: 12/07/2016 Document Reviewed: 03/07/2016 Elsevier Patient Education  2020 [employer].  Managing Your Hypertension Hypertension is commonly called high blood pressure. This is when the force of your blood pressing against the walls of your arteries is too strong. Arteries are blood vessels that carry blood from your heart throughout your body. Hypertension forces the heart to work harder to pump blood, and may cause the arteries to become narrow or stiff. Having untreated or uncontrolled hypertension can cause heart attack, stroke, kidney disease, and other problems. What are blood pressure readings? A blood pressure reading consists of a higher number over a lower number. Ideally, your blood pressure should be below 120/80. The first ("top") number is called the systolic pressure. It is a measure of the pressure in your arteries as your heart beats. The second ("bottom") number is called the diastolic pressure. It is a measure of the pressure in your arteries as the heart relaxes. What does my blood pressure reading mean? Blood pressure is classified into four stages. Based on your blood pressure reading, your health care provider may use the following stages to determine what type of treatment you need, if  any. Systolic pressure and diastolic pressure are measured in a unit called mm Hg. Normal  Systolic pressure: below 120.  Diastolic pressure: below 80. Elevated  Systolic pressure: 120-129.  Diastolic pressure: below 80. Hypertension stage 1  Systolic pressure: 130-139.  Diastolic pressure: 80-89. Hypertension stage 2  Systolic pressure: 140 or above.  Diastolic pressure: 90 or above. What health risks are associated with hypertension? Managing your  hypertension is an important responsibility. Uncontrolled hypertension can lead to:  A heart attack.  A stroke.  A weakened blood vessel (aneurysm).  Heart failure.  Kidney damage.  Eye damage.  Metabolic syndrome.  Memory and concentration problems. What changes can I make to manage my hypertension? Hypertension can be managed by making lifestyle changes and possibly by taking medicines. Your health care provider will help you make a plan to bring your blood pressure within a normal range. Eating and drinking   Eat a diet that is high in fiber and potassium, and low in salt (sodium), added sugar, and fat. An example eating plan is called the DASH (Dietary Approaches to Stop Hypertension) diet. To eat this way: ? Eat plenty of fresh fruits and vegetables. Try to fill half of your plate at each meal with fruits and vegetables. ? Eat whole grains, such as whole wheat pasta, brown rice, or whole grain bread. Fill about one quarter of your plate with whole grains. ? Eat low-fat diary products. ? Avoid fatty cuts of meat, processed or cured meats, and poultry with skin. Fill about one quarter of your plate with lean proteins such as fish, chicken without skin, beans, eggs, and tofu. ? Avoid premade and processed foods. These tend to be higher in sodium, added sugar, and fat.  Reduce your daily sodium intake. Most people with hypertension should eat less than 1,500 mg of sodium a day.  Limit alcohol intake to no more than 1  drink a day for nonpregnant women and 2 drinks a day for men. One drink equals 12 oz of beer, 5 oz of wine, or 1 oz of hard liquor. Lifestyle  Work with your health care provider to maintain a healthy body weight, or to lose weight. Ask what an ideal weight is for you.  Get at least 30 minutes of exercise that causes your heart to beat faster (aerobic exercise) most days of the week. Activities may include walking, swimming, or biking.  Include exercise to strengthen your muscles (resistance exercise), such as weight lifting, as part of your weekly exercise routine. Try to do these types of exercises for 30 minutes at least 3 days a week.  Do not use any products that contain nicotine or tobacco, such as cigarettes and e-cigarettes. If you need help quitting, ask your health care provider.  Control any long-term (chronic) conditions you have, such as high cholesterol or diabetes. Monitoring  Monitor your blood pressure at home as told by your health care provider. Your personal target blood pressure may vary depending on your medical conditions, your age, and other factors.  Have your blood pressure checked regularly, as often as told by your health care provider. Working with your health care provider  Review all the medicines you take with your health care provider because there may be side effects or interactions.  Talk with your health care provider about your diet, exercise habits, and other lifestyle factors that may be contributing to hypertension.  Visit your health care provider regularly. Your health care provider can help you create and adjust your plan for managing hypertension. Will I need medicine to control my blood pressure? Your health care provider may prescribe medicine if lifestyle changes are not enough to get your blood pressure under control, and if:  Your systolic blood pressure is 130 or higher.  Your diastolic blood pressure is 80 or higher. Take medicines  only as told by your health care provider. Follow the directions carefully. Blood  pressure medicines must be taken as prescribed. The medicine does not work as well when you skip doses. Skipping doses also puts you at risk for problems. Contact a health care provider if:  You think you are having a reaction to medicines you have taken.  You have repeated (recurrent) headaches.  You feel dizzy.  You have swelling in your ankles.  You have trouble with your vision. Get help right away if:  You develop a severe headache or confusion.  You have unusual weakness or numbness, or you feel faint.  You have severe pain in your chest or abdomen.  You vomit repeatedly.  You have trouble breathing. Summary  Hypertension is when the force of blood pumping through your arteries is too strong. If this condition is not controlled, it may put you at risk for serious complications.  Your personal target blood pressure may vary depending on your medical conditions, your age, and other factors. For most people, a normal blood pressure is less than 120/80.  Hypertension is managed by lifestyle changes, medicines, or both. Lifestyle changes include weight loss, eating a healthy, low-sodium diet, exercising more, and limiting alcohol. This information is not intended to replace advice given to you by your health care provider. Make sure you discuss any questions you have with your health care provider. Document Revised: 04/18/2018 Document Reviewed: 11/23/2015 Elsevier Patient Education  [place].

## 2019-06-23 ENCOUNTER — Encounter: Payer: Self-pay | Admitting: Nurse Practitioner

## 2019-06-23 DIAGNOSIS — Z125 Encounter for screening for malignant neoplasm of prostate: Secondary | ICD-10-CM | POA: Insufficient documentation

## 2019-06-23 DIAGNOSIS — Z789 Other specified health status: Secondary | ICD-10-CM | POA: Insufficient documentation

## 2019-06-23 DIAGNOSIS — H6123 Impacted cerumen, bilateral: Secondary | ICD-10-CM | POA: Insufficient documentation

## 2019-06-23 DIAGNOSIS — Z1159 Encounter for screening for other viral diseases: Secondary | ICD-10-CM | POA: Insufficient documentation

## 2019-06-23 LAB — HCV AB W REFLEX TO QUANT PCR: HCV Ab: <0.1 s/co ratio (ref 0.0–0.9)

## 2019-06-23 LAB — HCV INTERPRETATION

## 2019-06-29 ENCOUNTER — Telehealth: Payer: Self-pay | Admitting: Nurse Practitioner

## 2019-06-29 NOTE — Telephone Encounter (Signed)
There were no appts for today open; patient scheduled for tomorrow at 2:30pm

## 2019-06-29 NOTE — Telephone Encounter (Signed)
Please call him. I would like him to come in for a recheck of BP today and please place him on my afternoon schedule. If BP is down and he is still lightheaded, I will consider further imaging with carotid US and MRI. We need to discuss his short-term disability, as well.    Can you check on his Neurology referral? I do not see that he has an appt.   He has a Cardiology appt arranged for 07/10/19. It is very important for him to go to that. He cancelled my first referral.

## 2019-06-30 ENCOUNTER — Other Ambulatory Visit: Payer: Self-pay

## 2019-06-30 ENCOUNTER — Encounter: Payer: Self-pay | Admitting: Nurse Practitioner

## 2019-06-30 ENCOUNTER — Ambulatory Visit (INDEPENDENT_AMBULATORY_CARE_PROVIDER_SITE_OTHER): Payer: BC Managed Care – PPO | Admitting: Nurse Practitioner

## 2019-06-30 VITALS — BP 170/92 | HR 73 | Temp 97.4°F | Ht 74.0 in | Wt 210.0 lb

## 2019-06-30 DIAGNOSIS — H6123 Impacted cerumen, bilateral: Secondary | ICD-10-CM

## 2019-06-30 DIAGNOSIS — R7303 Prediabetes: Secondary | ICD-10-CM

## 2019-06-30 DIAGNOSIS — F17219 Nicotine dependence, cigarettes, with unspecified nicotine-induced disorders: Secondary | ICD-10-CM

## 2019-06-30 DIAGNOSIS — R42 Dizziness and giddiness: Secondary | ICD-10-CM | POA: Diagnosis not present

## 2019-06-30 DIAGNOSIS — Z6826 Body mass index (BMI) 26.0-26.9, adult: Secondary | ICD-10-CM

## 2019-06-30 DIAGNOSIS — I10 Essential (primary) hypertension: Secondary | ICD-10-CM

## 2019-06-30 DIAGNOSIS — R930 Abnormal findings on diagnostic imaging of skull and head, not elsewhere classified: Secondary | ICD-10-CM

## 2019-06-30 DIAGNOSIS — R0789 Other chest pain: Secondary | ICD-10-CM

## 2019-06-30 DIAGNOSIS — Z789 Other specified health status: Secondary | ICD-10-CM

## 2019-06-30 NOTE — Patient Instructions (Addendum)
Debrox for his ears.  Continue with current plan of care.  To the lab today.  Eye exam and referral placed to [facility]  Recheck BP in 2 weeks     Managing Your Hypertension Hypertension is commonly called high blood pressure. This is when the force of your blood pressing against the walls of your arteries is too strong. Arteries are blood vessels that carry blood from your heart throughout your body. Hypertension forces the heart to work harder to pump blood, and may cause the arteries to become narrow or stiff. Having untreated or uncontrolled hypertension can cause heart attack, stroke, kidney disease, and other problems. What are blood pressure readings? A blood pressure reading consists of a higher number over a lower number. Ideally, your blood pressure should be below 120/80. The first ("top") number is called the systolic pressure. It is a measure of the pressure in your arteries as your heart beats. The second ("bottom") number is called the diastolic pressure. It is a measure of the pressure in your arteries as the heart relaxes. What does my blood pressure reading mean? Blood pressure is classified into four stages. Based on your blood pressure reading, your health care provider may use the following stages to determine what type of treatment you need, if any. Systolic pressure and diastolic pressure are measured in a unit called mm Hg. Normal  Systolic pressure: below 120.  Diastolic pressure: below 80. Elevated  Systolic pressure: 120-129.  Diastolic pressure: below 80. Hypertension stage 1  Systolic pressure: 130-139.  Diastolic pressure: 80-89. Hypertension stage 2  Systolic pressure: 140 or above.  Diastolic pressure: 90 or above. What health risks are associated with hypertension? Managing your hypertension is an important responsibility. Uncontrolled hypertension can lead to:  A heart attack.  A stroke.  A weakened blood vessel (aneurysm).  Heart  failure.  Kidney damage.  Eye damage.  Metabolic syndrome.  Memory and concentration problems. What changes can I make to manage my hypertension? Hypertension can be managed by making lifestyle changes and possibly by taking medicines. Your health care provider will help you make a plan to bring your blood pressure within a normal range. Eating and drinking   Eat a diet that is high in fiber and potassium, and low in salt (sodium), added sugar, and fat. An example eating plan is called the DASH (Dietary Approaches to Stop Hypertension) diet. To eat this way: ? Eat plenty of fresh fruits and vegetables. Try to fill half of your plate at each meal with fruits and vegetables. ? Eat whole grains, such as whole wheat pasta, brown rice, or whole grain bread. Fill about one quarter of your plate with whole grains. ? Eat low-fat diary products. ? Avoid fatty cuts of meat, processed or cured meats, and poultry with skin. Fill about one quarter of your plate with lean proteins such as fish, chicken without skin, beans, eggs, and tofu. ? Avoid premade and processed foods. These tend to be higher in sodium, added sugar, and fat.  Reduce your daily sodium intake. Most people with hypertension should eat less than 1,500 mg of sodium a day.  Limit alcohol intake to no more than 1 drink a day for nonpregnant women and 2 drinks a day for men. One drink equals 12 oz of beer, 5 oz of wine, or 1 oz of hard liquor. Lifestyle  Work with your health care provider to maintain a healthy body weight, or to lose weight. Ask what an ideal weight  is for you.  Get at least 30 minutes of exercise that causes your heart to beat faster (aerobic exercise) most days of the week. Activities may include walking, swimming, or biking.  Include exercise to strengthen your muscles (resistance exercise), such as weight lifting, as part of your weekly exercise routine. Try to do these types of exercises for 30 minutes at least  3 days a week.  Do not use any products that contain nicotine or tobacco, such as cigarettes and e-cigarettes. If you need help quitting, ask your health care provider.  Control any long-term (chronic) conditions you have, such as high cholesterol or diabetes. Monitoring  Monitor your blood pressure at home as told by your health care provider. Your personal target blood pressure may vary depending on your medical conditions, your age, and other factors.  Have your blood pressure checked regularly, as often as told by your health care provider. Working with your health care provider  Review all the medicines you take with your health care provider because there may be side effects or interactions.  Talk with your health care provider about your diet, exercise habits, and other lifestyle factors that may be contributing to hypertension.  Visit your health care provider regularly. Your health care provider can help you create and adjust your plan for managing hypertension. Will I need medicine to control my blood pressure? Your health care provider may prescribe medicine if lifestyle changes are not enough to get your blood pressure under control, and if:  Your systolic blood pressure is 130 or higher.  Your diastolic blood pressure is 80 or higher. Take medicines only as told by your health care provider. Follow the directions carefully. Blood pressure medicines must be taken as prescribed. The medicine does not work as well when you skip doses. Skipping doses also puts you at risk for problems. Contact a health care provider if:  You think you are having a reaction to medicines you have taken.  You have repeated (recurrent) headaches.  You feel dizzy.  You have swelling in your ankles.  You have trouble with your vision. Get help right away if:  You develop a severe headache or confusion.  You have unusual weakness or numbness, or you feel faint.  You have severe pain in your  chest or abdomen.  You vomit repeatedly.  You have trouble breathing. Summary  Hypertension is when the force of blood pumping through your arteries is too strong. If this condition is not controlled, it may put you at risk for serious complications.  Your personal target blood pressure may vary depending on your medical conditions, your age, and other factors. For most people, a normal blood pressure is less than 120/80.  Hypertension is managed by lifestyle changes, medicines, or both. Lifestyle changes include weight loss, eating a healthy, low-sodium diet, exercising more, and limiting alcohol. This information is not intended to replace advice given to you by your health care provider. Make sure you discuss any questions you have with your health care provider. Document Revised: 04/18/2018 Document Reviewed: 11/23/2015 Elsevier Patient Education  2020 [employer]  Earwax Buildup, Adult The ears produce a substance called earwax that helps keep bacteria out of the ear and protects the skin in the ear canal. Occasionally, earwax can build up in the ear and cause discomfort or hearing loss. What increases the risk? This condition is more likely to develop in people who:  Are male.  Are elderly.  Naturally produce more earwax.  Clean  their ears often with cotton swabs.  Use earplugs often.  Use in-ear headphones often.  Wear hearing aids.  Have narrow ear canals.  Have earwax that is overly thick or sticky.  Have eczema.  Are dehydrated.  Have excess hair in the ear canal. What are the signs or symptoms? Symptoms of this condition include:  Reduced or muffled hearing.  A feeling of fullness in the ear or feeling that the ear is plugged.  Fluid coming from the ear.  Ear pain.  Ear itch.  Ringing in the ear.  Coughing.  An obvious piece of earwax that can be seen inside the ear canal. How is this diagnosed? This condition may be diagnosed based  on:  Your symptoms.  Your medical history.  An ear exam. During the exam, your health care provider will look into your ear with an instrument called an otoscope. You may have tests, including a hearing test. How is this treated? This condition may be treated by:  Using ear drops to soften the earwax.  Having the earwax removed by a health care provider. The health care provider may: ? Flush the ear with water. ? Use an instrument that has a loop on the end (curette). ? Use a suction device.  Surgery to remove the wax buildup. This may be done in severe cases. Follow these instructions at home:   Take over-the-counter and prescription medicines only as told by your health care provider.  Do not put any objects, including cotton swabs, into your ear. You can clean the opening of your ear canal with a washcloth or facial tissue.  Follow instructions from your health care provider about cleaning your ears. Do not over-clean your ears.  Drink enough fluid to keep your urine clear or pale yellow. This will help to thin the earwax.  Keep all follow-up visits as told by your health care provider. If earwax builds up in your ears often or if you use hearing aids, consider seeing your health care provider for routine, preventive ear cleanings. Ask your health care provider how often you should schedule your cleanings.  If you have hearing aids, clean them according to instructions from the manufacturer and your health care provider. Contact a health care provider if:  You have ear pain.  You develop a fever.  You have blood, pus, or other fluid coming from your ear.  You have hearing loss.  You have ringing in your ears that does not go away.  Your symptoms do not improve with treatment.  You feel like the room is spinning (vertigo). Summary  Earwax can build up in the ear and cause discomfort or hearing loss.  The most common symptoms of this condition include reduced or  muffled hearing and a feeling of fullness in the ear or feeling that the ear is plugged.  This condition may be diagnosed based on your symptoms, your medical history, and an ear exam.  This condition may be treated by using ear drops to soften the earwax or by having the earwax removed by a health care provider.  Do not put any objects, including cotton swabs, into your ear. You can clean the opening of your ear canal with a washcloth or facial tissue. This information is not intended to replace advice given to you by your health care provider. Make sure you discuss any questions you have with your health care provider. Document Revised: 12/07/2016 Document Reviewed: 03/07/2016 Elsevier Patient Education  2020 [employer].

## 2019-06-30 NOTE — Progress Notes (Signed)
Established Patient Office Visit  Subjective:  Patient ID: Omar Gayden, male    DOB: 1950-09-25  Age: 69 y.o. MRN: [id number]  CC:  Chief Complaint  Patient presents with  . 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  Presents for a 1 week follow-up  of uncontrolled hypertension and renal panel on new start losartan. Intermittent lightheadedness -improving.   HTN:  Presented 06/09/2019 with HTN-not on treatment.  Initial treatment with amlodipine 5 mg daily and blood pressure remained elevated and he reports feeling lightheaded.  He was started on losartan 50 mg 1 tablet daily 1 week ago. He checks his blood pressure at home with his wrist cuff and it is 148/98 -140/95- 137/96-132/81.  Office readings remain elevated and repeated by me with manual cuff.  BP Readings from Last 3 Encounters:  06/30/19 (!) 170/92  06/22/19 (!) 170/98  06/09/19 (!) 183/94   He gets out of bed in the morning he feels energetic and well rested.  He reports he takes his losartan in the morning and within 15 minutes,  it gives him a very slight feeling of lightheadedness/dizziness.   Maybe it is just anxiety, he is not sure.  Within 1-2 hours he feels fine again.  He has had the same feeling except more severe when he took amlodipine. He reports feeling so  lightheaded after taking amlodipine that everyday he had to lie down for 2 hours after taking it.    Lightheaded: Patient established care 06/09/2019 and reported an intermittent lightheaded sensation of 4 years duration.  He thinks it happens more in the warm weather.  It is becoming more frequent- every day he is experienced it.  He feels like  he is going to faint.  He has had no syncope.  He denies dizziness such as vertigo sensation.  Does not seem to be related to activity, position change or exertion.  Etiology has been unclear.   He has no chest pain pressure, heaviness, or tightness.  He has no headache.  He has noted no heart palpitations or skipped beats.  He does not get dizzy or lightheaded from moving from a supine to standing position. No falls.  Orthostatic vital signs have been negative.  Laboratory studies have been negative.  No anemia.  Negative micro albumin in the urine. He is overdue for eye exam with history of hypertension.  He has normal renal function.He has a congenital cardiac arrhythmia problem recent EKG's have not explained.  EKG 06/09/2019 showed normal sinus rhythm at 68 bpm with slightly widened QRS, normal axis, largely normal intervals, nonspecific ST changes.  Morphology not consistent with right bundle branch block.  He has an appoint with cardiology next week. CXR neg. Head CT in the ER showed an old right caudate infarct that he was unaware. He has appointment in August with neurology.  He has no neurological deficits on exam.  No carotid bruit.  He did lower his alcohol consumption down to 3 shots of liquor per day. Today, he reports drinking only one shot per day.    He has remained out of work from 05/26/19. We have not placed him out of work. He is employed at [employer] where he has  worked  for 4 years.  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  There are 12 big pots with machinery all around.  He has told us that he has not felt comfortable returning back to work with a lightheaded sensation. He was concerned about falling.  He reports today that his boss wants him to wait until he sees the specialists before he returns to work. They want a better picture of what is going with him.  He has had no falls or any fainting at home.  He continues to drive and lives alone.  Cerumen impaction: He has a  history of right  severe cerumen impaction, irrigated in our office, and cleaned out in the a ED. Still present. Lesser cerumen- but present in left ear.   He has been advised to use Debrox this week.  He has an appointment with ENT next week.  We have wondered if some of his lightheaded feeling may be inner ear/eustachian tube related.  CT of the head showed normal sinuses and no mastoid effusion.  Nicotine dependence:  Positive smoker 1 pack/day 50 pack years.  Advised to quit smoking.  Pre diabetes: BMI 26.9/ Overweight: Addressed diet and less alcohol.  Lab Results  Component Value Date   HGBA1C 6.3 06/22/2019    Past Medical History:  Diagnosis Date  . Arthritis   . Cardiac arrhythmia due to congenital heart disease    Dx with arrhtymia in his 77's   . Chickenpox   . History of DVT of lower extremity 2012   following knee surgery. Took warfarin for 1 year.   . Phlebitis     Past Surgical History:  Procedure Laterality Date  . KNEE SURGERY Bilateral    Repair of meniscus right  x 2 and left knee meniscus repair.     Family History  Problem Relation Age of Onset  . Heart disease Mother   . Heart disease Father   . Heart disease Sister     Social History   Socioeconomic History  . Marital status: Single    Spouse name: Not on file  . Number of children: Not on file  . Years of education: Not on file  . Highest education level: Some college, no degree  Occupational History  . Occupation: Child psychotherapist  Tobacco Use  . Smoking status: Current Every Day Smoker    Packs/day: 1.00    Years: 50.00    Pack years: 50.00    Types: Cigarettes  . Smokeless tobacco: Never Used  Vaping Use  . Vaping Use: Never used  Substance and Sexual Activity  . Alcohol use: Yes    Alcohol/week: 21.0 standard drinks    Types: 21 Shots of liquor per week  . Drug use: Never  . Sexual activity: Yes  Other Topics Concern  . Not on file  Social History Narrative   Works full time.  Lives alone. Divorced. Next of kin - [name] or son - [name]   Social Determinants of Health   Financial Resource Strain:   . Difficulty of Paying Living Expenses:   Food Insecurity:   . Worried About Programme researcher, broadcasting/film/video in the Last Year:   . Barista in the Last Year:   Transportation Needs:   . Freight forwarder (Medical):   Marland Kitchen Lack of Transportation (Non-Medical):   Physical Activity:   . Days of Exercise per Week:   . Minutes of Exercise per Session:   Stress:   .  Feeling of Stress :   Social Connections:   . Frequency of Communication with Friends and Family:   . Frequency of Social Gatherings with Friends and Family:   . Attends Religious Services:   . Active Member of Clubs or Organizations:   . Attends Archivist Meetings:   Marland Kitchen Marital Status:   Intimate Partner Violence:   . Fear of Current or Ex-Partner:   . Emotionally Abused:   Marland Kitchen Physically Abused:   . Sexually Abused:     Outpatient Medications Prior to Visit  Medication Sig Dispense Refill  . losartan (COZAAR) 50 MG tablet Take 1 tablet (50 mg total) by mouth daily. 90 tablet 3  . meloxicam (MOBIC) 15 MG tablet Take 15 mg by mouth daily.     No facility-administered medications prior to visit.    No Known Allergies   Review of Systems Pertinent positives noted  history of present illness otherwise negative.   Objective:    Physical Exam Vitals reviewed.  Constitutional:      Appearance: Normal appearance. He is normal weight.  HENT:     Head: Normocephalic.  Eyes:     Extraocular Movements: Extraocular movements intact.     Conjunctiva/sclera: Conjunctivae normal.     Pupils: Pupils are equal, round, and reactive to light.  Cardiovascular:     Rate and Rhythm: Normal rate and regular rhythm.     Pulses: Normal pulses.     Heart sounds: Normal heart sounds.  Pulmonary:     Effort: Pulmonary effort is normal.     Breath sounds: Normal breath sounds.  Musculoskeletal:         General: Normal range of motion.     Cervical back: Normal range of motion.  Skin:    General: Skin is warm and dry.  Neurological:     General: No focal deficit present.     Mental Status: He is alert and oriented to person, place, and time.     Cranial Nerves: No cranial nerve deficit.     Sensory: No sensory deficit.     Motor: No weakness.     Coordination: Romberg sign negative. Coordination normal. Finger-Nose-Finger Test normal.     Gait: Gait normal.     Deep Tendon Reflexes: Reflexes normal.    BP (!) 170/92 (BP Location: Left Arm, Patient Position: Sitting, Cuff Size: Normal)   Pulse 73   Temp (!) 97.4 F (36.3 C) (Skin)   Ht 6\' 2"  (1.88 m)   Wt 210 lb (95.3 kg)   SpO2 98%   BMI 26.96 kg/m  Wt Readings from Last 3 Encounters:  06/30/19 210 lb (95.3 kg)  06/22/19 211 lb 12.8 oz (96.1 kg)  06/09/19 211 lb 6.7 oz (95.9 kg)    Health Maintenance Due  Topic Date Due  . TETANUS/TDAP  Never done  . COLONOSCOPY  Never done  . PNA vac Low Risk Adult (1 of 2 - PCV13) Never done    There are no preventive care reminders to display for this patient.  Lab Results  Component Value Date   TSH 0.64 06/22/2019   Lab Results  Component Value Date   WBC 6.1 06/09/2019   HGB 15.3 06/22/2019   HCT 44.8 06/09/2019   MCV 85.8 06/09/2019   PLT 160 06/09/2019   Lab Results  Component Value Date   NA 140 06/30/2019   K 3.7 06/30/2019   CO2 26 06/30/2019   GLUCOSE 85 06/30/2019   BUN 15  06/30/2019   CREATININE 1.05 06/30/2019   BILITOT 0.5 06/22/2019   ALKPHOS 76 06/22/2019   AST 14 06/22/2019   ALT 12 06/22/2019   PROT 6.7 06/22/2019   ALBUMIN 4.4 06/22/2019   CALCIUM 9.3 06/30/2019   ANIONGAP 9 06/09/2019   GFR 85.18 06/30/2019   Lab Results  Component Value Date   CHOL 168 06/22/2019   Lab Results  Component Value Date   HDL 64.50 06/22/2019   Lab Results  Component Value Date   LDLCALC 94 06/22/2019   Lab Results  Component Value Date   TRIG 47.0  06/22/2019   Lab Results  Component Value Date   CHOLHDL 3 06/22/2019   Lab Results  Component Value Date   HGBA1C 6.3 06/22/2019   CLINICAL DATA:  Dizziness.  EXAM: CT HEAD WITHOUT CONTRAST  TECHNIQUE: Contiguous axial images were obtained from the base of the skull through the vertex without intravenous contrast.  COMPARISON:  None.  FINDINGS: Brain: Sequela of remote right caudate infarct. No acute infarct. No mass lesion. No midline shift, ventriculomegaly or extra-axial fluid collection.  Vascular: No hyperdense vessel. Bilateral carotid siphon atherosclerotic calcifications.  Skull: Negative for fracture or focal lesion.  Sinuses/Orbits: Normal orbits. Clear paranasal sinuses. No mastoid effusion.  Other: None.  IMPRESSION: No acute intracranial process.  Remote right caudate infarct.   Electronically Signed   By: Stana Buntinghikanele  Emekauwa M.D.   On: 06/09/2019 12:13 CLINICAL DATA:  Chest pressure. Additional provided: Patient reports recent diagnosis of URI, worsening dizziness and lightheadedness, current smoker, history of congenital heart disease  EXAM: CHEST - 2 VIEW  COMPARISON:  Chest radiograph 08/18/2014  FINDINGS: Heart size within normal limits.  There is no appreciable airspace consolidation.  No evidence of pleural effusion or pneumothorax.  No acute bony abnormality identified.  IMPRESSION: No evidence of acute cardiopulmonary abnormality.   Electronically Signed   By: Jackey LogeKyle  Golden DO   On: 06/09/2019 12:24   Assessment & Plan:   Problem List Items Addressed This Visit      Cardiovascular and Mediastinum   Essential hypertension - Primary    He checks his blood pressure at home with his wrist cuff and it is 148/98 -140/95- 137/96-132/81.Less lightheaded.  BP Readings from Last 3 Encounters:  06/30/19 (!) 170/92  06/22/19 (!) 170/98  06/09/19 (!) 183/94   BMet is normal on 50 mg Losartan x 1 week.  New start 06/22/2019. He has discordant blood pressure readings.  Here he remains very elevated and at home he reports improvement on losartan.  We need to have him bring his blood pressure cuff in and compare with ours.      Relevant Orders   Ambulatory referral to Ophthalmology   Basic Metabolic Panel (BMET) (Completed)     Nervous and Auditory   Bilateral impacted cerumen    Right ear has been irrigated and cleaned out by ED- still impacted. Dubrox and see ENT next week.  I would like ENT opinion on his intermittent lightheaded spells as well to see if there is some type of inner ear connection.  CT of the head showed normal sinuses.      Cigarette nicotine dependence with nicotine-induced disorder    I ppd and advised to cut back.         Other   Lightheadedness   Chest pressure   Abnormal CT of the head    I put a referral to neurology to go over the head CT findings of old  stroke.  Would like neurology opinion on chronic lightheadedness as well. No neurological deficits on exam.       Daily consumption of alcohol    He had cut back to 3 shots of liquor per day. Now, he is down to one shot per day. Feels better.       Pre-diabetes    Lab Results  Component Value Date   HGBA1C 6.3 06/22/2019   Weaning down on daily alcohol and advising healthy diet.       BMI 26.0-26.9,adult     No orders of the defined types were placed in this encounter. Debrox for his ears. ENT eval next week.  Cardiology eval next week.   He has discordant blood pressure readings.  Here he remains very elevated and at home he reports improvement on losartan.  We need to have him bring his blood pressure cuff in and compare with ours. I advised him to get upper arm blood pressure cuff as it is more accurate. Bring that into the clinic along with wrist BP to compare.   To the lab today to check Bmet on new start losartan 50 mg x 1 week.  .  Eye exam and referral placed to [facility]  Recheck BP  in 2 weeks   Follow-up: Return in about 2 weeks (around 07/14/2019).   This visit occurred during the SARS-CoV-2 public health emergency.  Safety protocols were in place, including screening questions prior to the visit, additional usage of staff PPE, and extensive cleaning of exam room while observing appropriate contact time as indicated for disinfecting solutions.   Amedeo Kinsman, NP

## 2019-07-01 DIAGNOSIS — Z6826 Body mass index (BMI) 26.0-26.9, adult: Secondary | ICD-10-CM | POA: Insufficient documentation

## 2019-07-01 DIAGNOSIS — R7303 Prediabetes: Secondary | ICD-10-CM | POA: Insufficient documentation

## 2019-07-01 DIAGNOSIS — F17219 Nicotine dependence, cigarettes, with unspecified nicotine-induced disorders: Secondary | ICD-10-CM | POA: Insufficient documentation

## 2019-07-01 LAB — BASIC METABOLIC PANEL
BUN: 15 mg/dL (ref 6–23)
CO2: 26 mEq/L (ref 19–32)
Calcium: 9.3 mg/dL (ref 8.4–10.5)
Chloride: 108 mEq/L (ref 96–112)
Creatinine, Ser: 1.05 mg/dL (ref 0.40–1.50)
GFR: 85.18 mL/min (ref >60.00)
Glucose, Bld: 85 mg/dL (ref 70–99)
Potassium: 3.7 mEq/L (ref 3.5–5.1)
Sodium: 140 mEq/L (ref 135–145)

## 2019-07-01 NOTE — Assessment & Plan Note (Signed)
Right ear has been irrigated and cleaned out by ED- still impacted. Dubrox and see ENT next week.  I would like ENT opinion on his intermittent lightheaded spells as well to see if there is some type of inner ear connection.  CT of the head showed normal sinuses.

## 2019-07-01 NOTE — Assessment & Plan Note (Signed)
He had cut back to 3 shots of liquor per day. Now, he is down to one shot per day. Feels better.

## 2019-07-01 NOTE — Assessment & Plan Note (Signed)
I ppd and advised to cut back.

## 2019-07-01 NOTE — Assessment & Plan Note (Signed)
I put a referral to neurology to go over the head CT findings of old stroke.  Would like neurology opinion on chronic lightheadedness as well. No neurological deficits on exam.

## 2019-07-01 NOTE — Assessment & Plan Note (Addendum)
He checks his blood pressure at home with his wrist cuff and it is 148/98 -140/95- 137/96-132/81.Less lightheaded.  BP Readings from Last 3 Encounters:  06/30/19 (!) 170/92  06/22/19 (!) 170/98  06/09/19 (!) 183/94   BMet is normal on 50 mg Losartan x 1 week. New start 06/22/2019. He has discordant blood pressure readings.  Here he remains very elevated and at home he reports improvement on losartan.  We need to have him bring his blood pressure cuff in and compare with ours.

## 2019-07-01 NOTE — Assessment & Plan Note (Signed)
Lab Results  Component Value Date   HGBA1C 6.3 06/22/2019   Weaning down on daily alcohol and advising healthy diet.

## 2019-07-02 ENCOUNTER — Telehealth: Payer: Self-pay | Admitting: Nurse Practitioner

## 2019-07-02 ENCOUNTER — Encounter: Payer: Self-pay | Admitting: Nurse Practitioner

## 2019-07-02 NOTE — Telephone Encounter (Signed)
We did this when patient was here for his office visit

## 2019-07-02 NOTE — Telephone Encounter (Signed)
So his wrist cuff is accurate.  And when he says BP is lower at home- then it is accurate. He may have white coat syndrome then.

## 2019-07-02 NOTE — Telephone Encounter (Signed)
Can he get a nurse visit- soon for him to bring in his BP cuff- wrist or arm- whatever he is using and we can compare with our office reading?  His BP in office his high- but he reports much better at home. He may have white coat syndrome. Thanks.

## 2019-07-02 NOTE — Telephone Encounter (Signed)
No,  he needs to see the Cardiologist as planned. His BP seems very variable.

## 2019-07-02 NOTE — Telephone Encounter (Signed)
That is correct. I asked patient when his BP is giving a good reading how is he feeling verse how hes feeling when his BP is up. Patient states that when his BP is up is when he is feeling dizzy and off and is feeling fine when BP is down. But yes we also talked about him possibly having white coat syndrome. Is there something you want me to call him about in particular today?

## 2019-07-06 ENCOUNTER — Ambulatory Visit: Payer: BC Managed Care – PPO | Admitting: Nurse Practitioner

## 2019-07-10 ENCOUNTER — Other Ambulatory Visit: Payer: Self-pay

## 2019-07-10 ENCOUNTER — Encounter: Payer: Self-pay | Admitting: Cardiology

## 2019-07-10 ENCOUNTER — Ambulatory Visit (INDEPENDENT_AMBULATORY_CARE_PROVIDER_SITE_OTHER): Payer: BC Managed Care – PPO | Admitting: Cardiology

## 2019-07-10 VITALS — BP 180/90 | HR 55 | Ht 74.0 in | Wt 215.4 lb

## 2019-07-10 DIAGNOSIS — F17200 Nicotine dependence, unspecified, uncomplicated: Secondary | ICD-10-CM

## 2019-07-10 DIAGNOSIS — F1010 Alcohol abuse, uncomplicated: Secondary | ICD-10-CM | POA: Diagnosis not present

## 2019-07-10 DIAGNOSIS — I10 Essential (primary) hypertension: Secondary | ICD-10-CM

## 2019-07-10 DIAGNOSIS — I2510 Atherosclerotic heart disease of native coronary artery without angina pectoris: Secondary | ICD-10-CM | POA: Diagnosis not present

## 2019-07-10 DIAGNOSIS — IMO0001 Reserved for inherently not codable concepts without codable children: Secondary | ICD-10-CM

## 2019-07-10 MED ORDER — ATORVASTATIN CALCIUM 20 MG PO TABS
20.0000 mg | ORAL_TABLET | Freq: Every day | ORAL | 6 refills | Status: AC
Start: 2019-07-10 — End: 2019-11-02

## 2019-07-10 MED ORDER — ATORVASTATIN CALCIUM 40 MG PO TABS
40.0000 mg | ORAL_TABLET | Freq: Every day | ORAL | 3 refills | Status: DC
Start: 2019-07-10 — End: 2019-07-10

## 2019-07-10 MED ORDER — LOSARTAN POTASSIUM 50 MG PO TABS: 50.0000 mg | ORAL_TABLET | Freq: Two times a day (BID) | ORAL | 6 refills | Status: DC

## 2019-07-10 MED ORDER — ASPIRIN EC 81 MG PO TBEC: 81.0000 mg | DELAYED_RELEASE_TABLET | Freq: Every day | ORAL | 3 refills | Status: AC

## 2019-07-10 NOTE — Progress Notes (Signed)
Cardiology Office Note:    Date:  07/10/2019   ID:  Steven Hill, DOB 1950-07-16, MRN [id number]  PCP:  Theadore Nan, NP  [facility] Cardiologist:  Debbe Odea, MD  [facility] Electrophysiologist:  None   Referring MD: Theadore Nan, NP   Chief Complaint  Patient presents with  . New Patient (Initial Visit)    Pt states having elevated BP/ lightheaded/dizziness/ chest discomfort/ Meds verbally reviewed w/ pt.   Steven Hill is a 69 y.o. male who is being seen today for the evaluation of hypertension at the request of Theadore Nan, NP.   History of Present Illness:    Steven Hill is a 69 y.o. male with a hx of hypertension, arthritis, DVT 2012 associated with knee surgery, nonobstructive CAD, current smoker x50+ years, who presents due to dizziness and elevated blood pressure.  Patient was diagnosed with hypertension in 2012.  Medical therapy was recommended.  Patient did not comply.  He recently established care 3 weeks ago and was started on losartan.  He states having dizziness for couple of minutes when he takes his losartan and then feels better after.   He had nonspecific chest discomfort in the 90s when he was in [place].  He had a left heart cath and was told he has nonobstructive CAD with about 50% stenosis.  Does not remember the name or location of cardiologist.  Does not take aspirin.  He drinks about 2 shots of vodka daily for the years now.  He currently denies chest pain or shortness of breath at rest or with exertion.  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  Past Medical History:  Diagnosis Date  . Arthritis   . Cardiac arrhythmia due to congenital heart disease    Dx with arrhtymia in his 30's   . Chickenpox   . History of DVT of lower extremity 2012   following knee surgery. Took warfarin for 1 year.   . Phlebitis     Past Surgical History:  Procedure Laterality Date  . KNEE SURGERY Bilateral     Repair of meniscus right  x 2 and left knee meniscus repair.     Current Medications: Current Meds  Medication Sig  . losartan (COZAAR) 50 MG tablet Take 1 tablet (50 mg total) by mouth in the morning and at bedtime.  . meloxicam (MOBIC) 15 MG tablet Take 15 mg by mouth daily.  . [DISCONTINUED] losartan (COZAAR) 50 MG tablet Take 1 tablet (50 mg total) by mouth daily.     Allergies:   Patient has no known allergies.   Social History   Socioeconomic History  . Marital status: Single    Spouse name: Not on file  . Number of children: Not on file  . Years of education: Not on file  . Highest education level: Some college, no degree  Occupational History  . Occupation: Child psychotherapist  Tobacco Use  . Smoking status: Current Every Day Smoker    Packs/day: 1.00    Years: 50.00    Pack years: 50.00    Types: Cigarettes  . Smokeless tobacco: Never Used  Vaping Use  . Vaping Use: Never used  Substance and Sexual Activity  . Alcohol use: Yes    Alcohol/week: 21.0 standard drinks    Types: 21 Shots of liquor per week  . Drug use: Never  . Sexual activity: Yes  Other Topics Concern  . Not on file  Social History Narrative   Works full time. Lives alone. Divorced. Next of kin - [name] or son - [name]   Social Determinants of Health   Financial Resource Strain:   . Difficulty of Paying Living Expenses:   Food Insecurity:   . Worried About Programme researcher, broadcasting/film/video in the Last Year:   . Barista in the Last Year:   Transportation Needs:   . Freight forwarder (Medical):   Marland Kitchen Lack of Transportation (Non-Medical):   Physical Activity:   . Days of Exercise per Week:   . Minutes of Exercise per Session:   Stress:   . Feeling of Stress :   Social Connections:   . Frequency of Communication with Friends and Family:   . Frequency of Social Gatherings with Friends and Family:   . Attends Religious Services:   . Active Member of Clubs or Organizations:   . Attends Tax inspector Meetings:   Marland Kitchen Marital Status:      Family History: The patient's family history includes Heart disease in his father, mother, and sister.  ROS:   Please see the history of present illness.     All other systems reviewed and are negative.  EKGs/Labs/Other Studies Reviewed:    The following studies were reviewed today:   EKG:  EKG is  ordered today.  The ekg ordered today demonstrates sinus bradycardia, heart rate 55, right bundle branch block, left anterior fascicular block.  Recent Labs: 06/09/2019: Platelets 160 06/22/2019: ALT 12; Hemoglobin 15.3; TSH 0.64 06/30/2019: BUN 15; Creatinine, Ser 1.05; Potassium 3.7; Sodium 140  Recent Lipid Panel    Component Value Date/Time   CHOL 168 06/22/2019 1025   TRIG 47.0 06/22/2019 1025   HDL 64.50 06/22/2019 1025   CHOLHDL 3 06/22/2019 1025   VLDL 9.4 06/22/2019 1025   LDLCALC 94 06/22/2019 1025    Physical Exam:    VS:  BP (!) 180/90 (BP Location: Right Arm, Patient Position: Sitting, Cuff Size: Normal)   Pulse (!) 55   Ht 6\' 2"  (1.88 m)   Wt 215 lb 6 oz (97.7 kg)   SpO2 98%   BMI 27.65 kg/m     Wt Readings from Last 3 Encounters:  07/10/19 215 lb 6 oz (97.7 kg)  06/30/19 210 lb (95.3 kg)  06/22/19 211 lb 12.8 oz (96.1 kg)     GEN:  Well nourished, well developed in no acute distress HEENT: Normal NECK: No JVD; No carotid bruits LYMPHATICS: No lymphadenopathy CARDIAC: RRR, no murmurs, rubs, gallops RESPIRATORY:  Clear to auscultation without rales, wheezing or rhonchi  ABDOMEN: Soft, non-tender, non-distended MUSCULOSKELETAL:  No edema; No deformity  SKIN: Warm and dry NEUROLOGIC:  Alert and oriented x 3 PSYCHIATRIC:  Normal affect   ASSESSMENT:    1. Coronary artery disease involving native coronary artery of native heart without angina pectoris   2. Essential hypertension   3. Smoking   4. ETOH abuse    PLAN:    In order of problems listed above:  1. Patient with reported history of  nonobstructive CAD.  He denies any symptoms of chest pain or shortness of breath at rest or with exertion.  Start aspirin 81 mg daily, recent cholesterol work week LDL 94.  Not at goal.  Start Lipitor 20 mg daily.  Get echocardiogram to evaluate systolic and diastolic function. 2. History of hypertension, blood pressure not well controlled.  Increase losartan to 50 mg twice daily.  Alcohol cessation advised  as this is contributing to elevated blood pressure readings. 3. He is a current smoker x50 years.  Smoking cessation advised.  Over 5 minutes spent counseling patient. 4. History of daily alcohol intake.  Cessation/cut back recommended.  Follow-up in 1 month.  This note was generated in part or whole with voice recognition software. Voice recognition is usually quite accurate but there are transcription errors that can and very often do occur. I apologize for any typographical errors that were not detected and corrected.  Medication Adjustments/Labs and Tests Ordered: Current medicines are reviewed at length with the patient today.  Concerns regarding medicines are outlined above.  Orders Placed This Encounter  Procedures  . EKG 12-Lead  . ECHOCARDIOGRAM COMPLETE   Meds ordered this encounter  Medications  . aspirin EC 81 MG tablet    Sig: Take 1 tablet (81 mg total) by mouth daily. Swallow whole.    Dispense:  90 tablet    Refill:  3  . DISCONTD: atorvastatin (LIPITOR) 40 MG tablet    Sig: Take 1 tablet (40 mg total) by mouth daily.    Dispense:  90 tablet    Refill:  3  . losartan (COZAAR) 50 MG tablet    Sig: Take 1 tablet (50 mg total) by mouth in the morning and at bedtime.    Dispense:  60 tablet    Refill:  6  . atorvastatin (LIPITOR) 20 MG tablet    Sig: Take 1 tablet (20 mg total) by mouth daily.    Dispense:  30 tablet    Refill:  6    Patient Instructions  Medication Instructions:   Your physician has recommended you make the following change in your medication:     1.  START taking ASA 81mg :  Take 1 tablet (81 mg total) by mouth daily. 2.  START taking Lipitor(Atorvastatin):   Take 1 tablet (20 mg total) by mouth daily. 3.  INCREASE your Cozaar (Losartan):  Take 1 tablet (50 mg total) by mouth in the morning and at bedtime.   *If you need a refill on your cardiac medications before your next appointment, please call your pharmacy*   Lab Work: None Ordered If you have labs (blood work) drawn today and your tests are completely normal, you will receive your results only by: Marland Kitchen. MyChart Message (if you have MyChart) OR . A paper copy in the mail If you have any lab test that is abnormal or we need to change your treatment, we will call you to review the results.   Testing/Procedures:  Your physician has requested that you have an echocardiogram. Echocardiography is a painless test that uses sound waves to create images of your heart. It provides your doctor with information about the size and shape of your heart and how well your heart's chambers and valves are working. This procedure takes approximately one hour. There are no restrictions for this procedure.     Follow-Up: [facility], you and your health needs are our priority.  As part of our continuing mission to provide you with exceptional heart care, we have created designated Provider Care Teams.  These Care Teams include your primary Cardiologist (physician) and Advanced Practice Providers (APPs -  Physician Assistants and Nurse Practitioners) who all work together to provide you with the care you need, when you need it.  We recommend signing up for the patient portal called "MyChart".  Sign up information is provided on this After Visit Summary.  MyChart is  used to connect with patients for Virtual Visits (Telemedicine).  Patients are able to view lab/test results, encounter notes, upcoming appointments, etc.  Non-urgent messages can be sent to your provider as well.   To learn more  about what you can do with MyChart, go to [email or web address].    Your next appointment:   4 week(s)  The format for your next appointment:   In Person  Provider:   Debbe Odea, MD   Other Instructions   Echocardiogram An echocardiogram is a procedure that uses painless sound waves (ultrasound) to produce an image of the heart. Images from an echocardiogram can provide important information about:  Signs of coronary artery disease (CAD).  Aneurysm detection. An aneurysm is a weak or damaged part of an artery wall that bulges out from the normal force of blood pumping through the body.  Heart size and shape. Changes in the size or shape of the heart can be associated with certain conditions, including heart failure, aneurysm, and CAD.  Heart muscle function.  Heart valve function.  Signs of a past heart attack.  Fluid buildup around the heart.  Thickening of the heart muscle.  A tumor or infectious growth around the heart valves. Tell a health care provider about:  Any allergies you have.  All medicines you are taking, including vitamins, herbs, eye drops, creams, and over-the-counter medicines.  Any blood disorders you have.  Any surgeries you have had.  Any medical conditions you have.  Whether you are pregnant or may be pregnant. What are the risks? Generally, this is a safe procedure. However, problems may occur, including:  Allergic reaction to dye (contrast) that may be used during the procedure. What happens before the procedure? No specific preparation is needed. You may eat and drink normally. What happens during the procedure?   An IV tube may be inserted into one of your veins.  You may receive contrast through this tube. A contrast is an injection that improves the quality of the pictures from your heart.  A gel will be applied to your chest.  A wand-like tool (transducer) will be moved over your chest. The gel will help to transmit  the sound waves from the transducer.  The sound waves will harmlessly bounce off of your heart to allow the heart images to be captured in real-time motion. The images will be recorded on a computer. The procedure may vary among health care providers and hospitals. What happens after the procedure?  You may return to your normal, everyday life, including diet, activities, and medicines, unless your health care provider tells you not to do that. Summary  An echocardiogram is a procedure that uses painless sound waves (ultrasound) to produce an image of the heart.  Images from an echocardiogram can provide important information about the size and shape of your heart, heart muscle function, heart valve function, and fluid buildup around your heart.  You do not need to do anything to prepare before this procedure. You may eat and drink normally.  After the echocardiogram is completed, you may return to your normal, everyday life, unless your health care provider tells you not to do that. This information is not intended to replace advice given to you by your health care provider. Make sure you discuss any questions you have with your health care provider. Document Revised: 04/17/2018 Document Reviewed: 01/28/2016 Elsevier Patient Education  2020 Elsevier Inc.  Atorvastatin tablets What is this medicine? ATORVASTATIN (a TORE va sta tin) is known  as a HMG-CoA reductase inhibitor or 'statin'. It lowers the level of cholesterol and triglycerides in the blood. This drug may also reduce the risk of heart attack, stroke, or other health problems in patients with risk factors for heart disease. Diet and lifestyle changes are often used with this drug. This medicine may be used for other purposes; ask your health care provider or pharmacist if you have questions. COMMON BRAND NAME(S): Lipitor What should I tell my health care provider before I take this medicine? They need to know if you have any of  these conditions:  diabetes  if you often drink alcohol  history of stroke  kidney disease  liver disease  muscle aches or weakness  thyroid disease  an unusual or allergic reaction to atorvastatin, other medicines, foods, dyes, or preservatives  pregnant or trying to get pregnant  breast-feeding How should I use this medicine? Take this medicine by mouth with a glass of water. Follow the directions on the prescription label. You can take it with or without food. If it upsets your stomach, take it with food. Do not take with grapefruit juice. Take your medicine at regular intervals. Do not take it more often than directed. Do not stop taking except on your doctor's advice. Talk to your pediatrician regarding the use of this medicine in children. While this drug may be prescribed for children as young as 10 for selected conditions, precautions do apply. Overdosage: If you think you have taken too much of this medicine contact a poison control center or emergency room at once. NOTE: This medicine is only for you. Do not share this medicine with others. What if I miss a dose? If you miss a dose, take it as soon as you can. If your next dose is to be taken in less than 12 hours, then do not take the missed dose. Take the next dose at your regular time. Do not take double or extra doses. What may interact with this medicine? Do not take this medicine with any of the following medications:  dasabuvir; ombitasvir; paritaprevir; ritonavir  ombitasvir; paritaprevir; ritonavir  posaconazole  red yeast rice This medicine may also interact with the following medications:  alcohol  birth control pills  certain antibiotics like erythromycin and clarithromycin  certain antivirals for HIV or hepatitis  certain medicines for cholesterol like fenofibrate, gemfibrozil, and niacin  certain medicines for fungal infections like ketoconazole and  itraconazole  colchicine  cyclosporine  digoxin  grapefruit juice  rifampin This list may not describe all possible interactions. Give your health care provider a list of all the medicines, herbs, non-prescription drugs, or dietary supplements you use. Also tell them if you smoke, drink alcohol, or use illegal drugs. Some items may interact with your medicine. What should I watch for while using this medicine? Visit your doctor or health care professional for regular check-ups. You may need regular tests to make sure your liver is working properly. Your health care professional may tell you to stop taking this medicine if you develop muscle problems. If your muscle problems do not go away after stopping this medicine, contact your health care professional. Do not become pregnant while taking this medicine. Women should inform their health care professional if they wish to become pregnant or think they might be pregnant. There is a potential for serious side effects to an unborn child. Talk to your health care professional or pharmacist for more information. Do not breast-feed an infant while taking this medicine.  This medicine may increase blood sugar. Ask your healthcare provider if changes in diet or medicines are needed if you have diabetes. If you are going to need surgery or other procedure, tell your doctor that you are using this medicine. This drug is only part of a total heart-health program. Your doctor or a dietician can suggest a low-cholesterol and low-fat diet to help. Avoid alcohol and smoking, and keep a proper exercise schedule. This medicine may cause a decrease in Co-Enzyme Q-10. You should make sure that you get enough Co-Enzyme Q-10 while you are taking this medicine. Discuss the foods you eat and the vitamins you take with your health care professional. What side effects may I notice from receiving this medicine? Side effects that you should report to your doctor or health  care professional as soon as possible:  allergic reactions like skin rash, itching or hives, swelling of the face, lips, or tongue  fever  joint pain  loss of memory  redness, blistering, peeling or loosening of the skin, including inside the mouth  signs and symptoms of high blood sugar such as being more thirsty or hungry or having to urinate more than normal. You may also feel very tired or have blurry vision.  signs and symptoms of liver injury like dark yellow or brown urine; general ill feeling or flu-like symptoms; light-belly pain; unusually weak or tired; yellowing of the eyes or skin  signs and symptoms of muscle injury like dark urine; trouble passing urine or change in the amount of urine; unusually weak or tired; muscle pain or side or back pain Side effects that usually do not require medical attention (report to your doctor or health care professional if they continue or are bothersome):  diarrhea  nausea  stomach pain  trouble sleeping  upset stomach This list may not describe all possible side effects. Call your doctor for medical advice about side effects. You may report side effects to FDA at 1-800-FDA-1088. Where should I keep my medicine? Keep out of the reach of children. Store between 20 and 25 degrees C (68 and 77 degrees F). Throw away any unused medicine after the expiration date. NOTE: This sheet is a summary. It may not cover all possible information. If you have questions about this medicine, talk to your doctor, pharmacist, or health care provider.  2020 Elsevier/Gold Standard (2017-10-16 11:36:16)       Signed, Debbe Odea, MD  07/10/2019 12:32 PM     [facility]

## 2019-07-10 NOTE — Patient Instructions (Signed)
Medication Instructions:   Your physician has recommended you make the following change in your medication:   1.  START taking ASA 81mg :  Take 1 tablet (81 mg total) by mouth daily. 2.  START taking Lipitor(Atorvastatin):   Take 1 tablet (20 mg total) by mouth daily. 3.  INCREASE your Cozaar (Losartan):  Take 1 tablet (50 mg total) by mouth in the morning and at bedtime.   *If you need a refill on your cardiac medications before your next appointment, please call your pharmacy*   Lab Work: None Ordered If you have labs (blood work) drawn today and your tests are completely normal, you will receive your results only by: MyChart Message (if you have MyChart) OR . A paper copy in the mail If you have any lab test that is abnormal or we need to change your treatment, we will call you to review the results.   Testing/Procedures:  Your physician has requested that you have an echocardiogram. Echocardiography is a painless test that uses sound waves to create images of your heart. It provides your doctor with information about the size and shape of your heart and how well your heart's chambers and valves are working. This procedure takes approximately one hour. There are no restrictions for this procedure.     Follow-Up: At [facility], you and your health needs are our priority.  As part of our continuing mission to provide you with exceptional heart care, we have created designated Provider Care Teams.  These Care Teams include your primary Cardiologist (physician) and Advanced Practice Providers (APPs -  Physician Assistants and Nurse Practitioners) who all work together to provide you with the care you need, when you need it.  We recommend signing up for the patient portal called "MyChart".  Sign up information is provided on this After Visit Summary.  MyChart is used to connect with patients for Virtual Visits (Telemedicine).  Patients are able to view lab/test results, encounter  notes, upcoming appointments, etc.  Non-urgent messages can be sent to your provider as well.   To learn more about what you can do with MyChart, go to [facility].    Your next appointment:   4 week(s)  The format for your next appointment:   In Person  Provider:   [email or web address], MD   Other Instructions   Echocardiogram An echocardiogram is a procedure that uses painless sound waves (ultrasound) to produce an image of the heart. Images from an echocardiogram can provide important information about:  Signs of coronary artery disease (CAD).  Aneurysm detection. An aneurysm is a weak or damaged part of an artery wall that bulges out from the normal force of blood pumping through the body.  Heart size and shape. Changes in the size or shape of the heart can be associated with certain conditions, including heart failure, aneurysm, and CAD.  Heart muscle function.  Heart valve function.  Signs of a past heart attack.  Fluid buildup around the heart.  Thickening of the heart muscle.  A tumor or infectious growth around the heart valves. Tell a health care provider about:  Any allergies you have.  All medicines you are taking, including vitamins, herbs, eye drops, creams, and over-the-counter medicines.  Any blood disorders you have.  Any surgeries you have had.  Any medical conditions you have.  Whether you are pregnant or may be pregnant. What are the risks? Generally, this is a safe procedure. However, problems may occur, including:  Allergic reaction  to dye (contrast) that may be used during the procedure. What happens before the procedure? No specific preparation is needed. You may eat and drink normally. What happens during the procedure?   An IV tube may be inserted into one of your veins.  You may receive contrast through this tube. A contrast is an injection that improves the quality of the pictures from your heart.  A gel will be  applied to your chest.  A wand-like tool (transducer) will be moved over your chest. The gel will help to transmit the sound waves from the transducer.  The sound waves will harmlessly bounce off of your heart to allow the heart images to be captured in real-time motion. The images will be recorded on a computer. The procedure may vary among health care providers and hospitals. What happens after the procedure?  You may return to your normal, everyday life, including diet, activities, and medicines, unless your health care provider tells you not to do that. Summary  An echocardiogram is a procedure that uses painless sound waves (ultrasound) to produce an image of the heart.  Images from an echocardiogram can provide important information about the size and shape of your heart, heart muscle function, heart valve function, and fluid buildup around your heart.  You do not need to do anything to prepare before this procedure. You may eat and drink normally.  After the echocardiogram is completed, you may return to your normal, everyday life, unless your health care provider tells you not to do that. This information is not intended to replace advice given to you by your health care provider. Make sure you discuss any questions you have with your health care provider. Document Revised: 04/17/2018 Document Reviewed: 01/28/2016 Elsevier Patient Education  2020 Elsevier Inc.  Atorvastatin tablets What is this medicine? ATORVASTATIN (a TORE va sta tin) is known as a HMG-CoA reductase inhibitor or 'statin'. It lowers the level of cholesterol and triglycerides in the blood. This drug may also reduce the risk of heart attack, stroke, or other health problems in patients with risk factors for heart disease. Diet and lifestyle changes are often used with this drug. This medicine may be used for other purposes; ask your health care provider or pharmacist if you have questions. COMMON BRAND NAME(S):  Lipitor What should I tell my health care provider before I take this medicine? They need to know if you have any of these conditions:  diabetes  if you often drink alcohol  history of stroke  kidney disease  liver disease  muscle aches or weakness  thyroid disease  an unusual or allergic reaction to atorvastatin, other medicines, foods, dyes, or preservatives  pregnant or trying to get pregnant  breast-feeding How should I use this medicine? Take this medicine by mouth with a glass of water. Follow the directions on the prescription label. You can take it with or without food. If it upsets your stomach, take it with food. Do not take with grapefruit juice. Take your medicine at regular intervals. Do not take it more often than directed. Do not stop taking except on your doctor's advice. Talk to your pediatrician regarding the use of this medicine in children. While this drug may be prescribed for children as young as 10 for selected conditions, precautions do apply. Overdosage: If you think you have taken too much of this medicine contact a poison control center or emergency room at once. NOTE: This medicine is only for you. Do not share this  medicine with others. What if I miss a dose? If you miss a dose, take it as soon as you can. If your next dose is to be taken in less than 12 hours, then do not take the missed dose. Take the next dose at your regular time. Do not take double or extra doses. What may interact with this medicine? Do not take this medicine with any of the following medications:  dasabuvir; ombitasvir; paritaprevir; ritonavir  ombitasvir; paritaprevir; ritonavir  posaconazole  red yeast rice This medicine may also interact with the following medications:  alcohol  birth control pills  certain antibiotics like erythromycin and clarithromycin  certain antivirals for HIV or hepatitis  certain medicines for cholesterol like fenofibrate, gemfibrozil,  and niacin  certain medicines for fungal infections like ketoconazole and itraconazole  colchicine  cyclosporine  digoxin  grapefruit juice  rifampin This list may not describe all possible interactions. Give your health care provider a list of all the medicines, herbs, non-prescription drugs, or dietary supplements you use. Also tell them if you smoke, drink alcohol, or use illegal drugs. Some items may interact with your medicine. What should I watch for while using this medicine? Visit your doctor or health care professional for regular check-ups. You may need regular tests to make sure your liver is working properly. Your health care professional may tell you to stop taking this medicine if you develop muscle problems. If your muscle problems do not go away after stopping this medicine, contact your health care professional. Do not become pregnant while taking this medicine. Women should inform their health care professional if they wish to become pregnant or think they might be pregnant. There is a potential for serious side effects to an unborn child. Talk to your health care professional or pharmacist for more information. Do not breast-feed an infant while taking this medicine. This medicine may increase blood sugar. Ask your healthcare provider if changes in diet or medicines are needed if you have diabetes. If you are going to need surgery or other procedure, tell your doctor that you are using this medicine. This drug is only part of a total heart-health program. Your doctor or a dietician can suggest a low-cholesterol and low-fat diet to help. Avoid alcohol and smoking, and keep a proper exercise schedule. This medicine may cause a decrease in Co-Enzyme Q-10. You should make sure that you get enough Co-Enzyme Q-10 while you are taking this medicine. Discuss the foods you eat and the vitamins you take with your health care professional. What side effects may I notice from receiving  this medicine? Side effects that you should report to your doctor or health care professional as soon as possible:  allergic reactions like skin rash, itching or hives, swelling of the face, lips, or tongue  fever  joint pain  loss of memory  redness, blistering, peeling or loosening of the skin, including inside the mouth  signs and symptoms of high blood sugar such as being more thirsty or hungry or having to urinate more than normal. You may also feel very tired or have blurry vision.  signs and symptoms of liver injury like dark yellow or brown urine; general ill feeling or flu-like symptoms; light-belly pain; unusually weak or tired; yellowing of the eyes or skin  signs and symptoms of muscle injury like dark urine; trouble passing urine or change in the amount of urine; unusually weak or tired; muscle pain or side or back pain Side effects that usually do  not require medical attention (report to your doctor or health care professional if they continue or are bothersome):  diarrhea  nausea  stomach pain  trouble sleeping  upset stomach This list may not describe all possible side effects. Call your doctor for medical advice about side effects. You may report side effects to FDA at 1-800-FDA-1088. Where should I keep my medicine? Keep out of the reach of children. Store between 20 and 25 degrees C (68 and 77 degrees F). Throw away any unused medicine after the expiration date. NOTE: This sheet is a summary. It may not cover all possible information. If you have questions about this medicine, talk to your doctor, pharmacist, or health care provider.  2020 Elsevier/Gold Standard (2017-10-16 11:36:16)

## 2019-07-14 ENCOUNTER — Ambulatory Visit (INDEPENDENT_AMBULATORY_CARE_PROVIDER_SITE_OTHER): Payer: BC Managed Care – PPO | Admitting: Nurse Practitioner

## 2019-07-14 ENCOUNTER — Ambulatory Visit: Payer: BC Managed Care – PPO | Admitting: Nurse Practitioner

## 2019-07-14 ENCOUNTER — Other Ambulatory Visit: Payer: Self-pay

## 2019-07-14 ENCOUNTER — Encounter: Payer: Self-pay | Admitting: Nurse Practitioner

## 2019-07-14 VITALS — BP 140/64 | HR 68 | Temp 97.9°F | Wt 210.0 lb

## 2019-07-14 DIAGNOSIS — R0789 Other chest pain: Secondary | ICD-10-CM

## 2019-07-14 DIAGNOSIS — R42 Dizziness and giddiness: Secondary | ICD-10-CM | POA: Diagnosis not present

## 2019-07-14 DIAGNOSIS — I10 Essential (primary) hypertension: Secondary | ICD-10-CM

## 2019-07-14 DIAGNOSIS — E785 Hyperlipidemia, unspecified: Secondary | ICD-10-CM

## 2019-07-14 DIAGNOSIS — R930 Abnormal findings on diagnostic imaging of skull and head, not elsewhere classified: Secondary | ICD-10-CM | POA: Diagnosis not present

## 2019-07-14 NOTE — Progress Notes (Signed)
Established Patient Office Visit  Subjective:  Patient ID: Steven Hill, male    DOB: [birth year]  Age: 69 y.o. MRN: [id number]  CC:  Chief Complaint  Patient presents with  . Follow-up    hypertension   HPI Steven Hill is a 69 yo who presents for follow up of HTN, lightheadedness, and resolved chest pain. He has seen Cardiology- Dr. Azucena Cecil on 07/10/2019 and the office note reviewed. His BP was 180/90 in the office. The pt as endorsed lower BP at home. His losartan 25 mg was increased to twice daily. He was started on Asa 81 mg. An echo is planned for the near future.  He has no chest pain, pressure, heaviness, tightness, or DOE.  He does not feel at risk for falling now.  He does not feel at risk for fainting now.  He has continued follow-up plans with Cardiology. He had an eye exam.    BP Readings from Last 3 Encounters:  07/14/19 140/64  07/10/19 (!) 180/90  06/30/19 (!) 170/92    Pulse Readings from Last 3 Encounters:  07/14/19 68  07/10/19 (!) 55  06/30/19 73   He saw ENT and had his cerumen impactions removed. He is hearing better and is less lightheaded. He is feeling better.   He had a non acute abnormality on his head CT and has been referred to Neurology, Dr. Sherryll Burger at [facility].  He says he  has an appt- but cannot tell me when.   His alcohol consumption is down to 1 shot daily.  He has been advised to discontinue altogether he is planning on doing that as well.  He has been advised to stop smoking and has cut down by half.   He saw [facility] for hip pain and had a hip injection, is on Mobic 15 mg daily.  He plans to see Ortho again.   Past Medical History:  Diagnosis Date  . Arthritis   . Cardiac arrhythmia due to congenital heart disease    Dx with arrhtymia in his 69's   . Chickenpox   . History of DVT of lower extremity 2012   following knee surgery. Took warfarin for 1 year.   . Phlebitis     Past Surgical History:  Procedure Laterality Date  . KNEE  SURGERY Bilateral    Repair of meniscus right  x 2 and left knee meniscus repair.     Family History  Problem Relation Age of Onset  . Heart disease Mother   . Heart disease Father   . Heart disease Sister     Social History   Socioeconomic History  . Marital status: Single    Spouse name: Not on file  . Number of children: Not on file  . Years of education: Not on file  . Highest education level: Some college, no degree  Occupational History  . Occupation: Child psychotherapist  Tobacco Use  . Smoking status: Current Every Day Smoker    Packs/day: 1.00    Years: 50.00    Pack years: 50.00    Types: Cigarettes  . Smokeless tobacco: Never Used  Vaping Use  . Vaping Use: Never used  Substance and Sexual Activity  . Alcohol use: Yes    Alcohol/week: 21.0 standard drinks    Types: 21 Shots of liquor per week  . Drug use: Never  . Sexual activity: Yes  Other Topics Concern  . Not on file  Social History Narrative   Works full time. Lives  alone. Divorced. Next of kin - [name] or son - [name]   Social Determinants of Health   Financial Resource Strain:   . Difficulty of Paying Living Expenses:   Food Insecurity:   . Worried About Programme researcher, broadcasting/film/video in the Last Year:   . Barista in the Last Year:   Transportation Needs:   . Freight forwarder (Medical):   Marland Kitchen Lack of Transportation (Non-Medical):   Physical Activity:   . Days of Exercise per Week:   . Minutes of Exercise per Session:   Stress:   . Feeling of Stress :   Social Connections:   . Frequency of Communication with Friends and Family:   . Frequency of Social Gatherings with Friends and Family:   . Attends Religious Services:   . Active Member of Clubs or Organizations:   . Attends Banker Meetings:   Marland Kitchen Marital Status:   Intimate Partner Violence:   . Fear of Current or Ex-Partner:   . Emotionally Abused:   Marland Kitchen Physically Abused:   . Sexually Abused:     Outpatient Medications Prior  to Visit  Medication Sig Dispense Refill  . aspirin EC 81 MG tablet Take 1 tablet (81 mg total) by mouth daily. Swallow whole. 90 tablet 3  . atorvastatin (LIPITOR) 20 MG tablet Take 1 tablet (20 mg total) by mouth daily. 30 tablet 6  . losartan (COZAAR) 50 MG tablet Take 1 tablet (50 mg total) by mouth in the morning and at bedtime. 60 tablet 6  . meloxicam (MOBIC) 15 MG tablet Take 15 mg by mouth daily.    Marland Kitchen amLODipine (NORVASC) 5 MG tablet Take 5 mg by mouth daily.     No facility-administered medications prior to visit.    No Known Allergies  Review of Systems  Constitutional: Negative for chills and fever.  HENT: Negative for congestion.   Eyes: Negative.   Respiratory: Negative for cough and shortness of breath.   Cardiovascular: Negative for chest pain, palpitations and leg swelling.  Gastrointestinal: Negative.   Endocrine: Negative.   Genitourinary: Negative.   Musculoskeletal: Negative.   Allergic/Immunologic: Negative.   Psychiatric/Behavioral:       He has no concerns with depression/anxiety.       Objective:    Physical Exam Vitals reviewed.  Constitutional:      Appearance: Normal appearance. He is normal weight.  HENT:     Head: Normocephalic and atraumatic.  Eyes:     Pupils: Pupils are equal, round, and reactive to light.  Cardiovascular:     Rate and Rhythm: Normal rate and regular rhythm.     Pulses: Normal pulses.     Heart sounds: Normal heart sounds.  Pulmonary:     Effort: Pulmonary effort is normal.     Breath sounds: Normal breath sounds.  Abdominal:     Palpations: Abdomen is soft.     Tenderness: There is no abdominal tenderness.  Musculoskeletal:        General: Normal range of motion.     Cervical back: Normal range of motion and neck supple.  Skin:    General: Skin is warm and dry.  Neurological:     General: No focal deficit present.     Mental Status: He is alert and oriented to person, place, and time.  Psychiatric:         Mood and Affect: Mood normal.        Behavior: Behavior normal.  Thought Content: Thought content normal.        Judgment: Judgment normal.     BP 140/64 (BP Location: Left Arm, Patient Position: Sitting, Cuff Size: Normal)   Pulse 68   Temp 97.9 F (36.6 C) (Oral)   Wt 210 lb (95.3 kg)   SpO2 99%   BMI 26.96 kg/m  Wt Readings from Last 3 Encounters:  07/14/19 210 lb (95.3 kg)  07/10/19 215 lb 6 oz (97.7 kg)  06/30/19 210 lb (95.3 kg)     Health Maintenance Due  Topic Date Due  . TETANUS/TDAP  Never done  . COLONOSCOPY  Never done  . PNA vac Low Risk Adult (1 of 2 - PCV13) Never done    There are no preventive care reminders to display for this patient.  Lab Results  Component Value Date   TSH 0.64 06/22/2019   Lab Results  Component Value Date   WBC 6.1 06/09/2019   HGB 15.3 06/22/2019   HCT 44.8 06/09/2019   MCV 85.8 06/09/2019   PLT 160 06/09/2019   Lab Results  Component Value Date   NA 140 06/30/2019   K 3.7 06/30/2019   CO2 26 06/30/2019   GLUCOSE 85 06/30/2019   BUN 15 06/30/2019   CREATININE 1.05 06/30/2019   BILITOT 0.5 06/22/2019   ALKPHOS 76 06/22/2019   AST 14 06/22/2019   ALT 12 06/22/2019   PROT 6.7 06/22/2019   ALBUMIN 4.4 06/22/2019   CALCIUM 9.3 06/30/2019   ANIONGAP 9 06/09/2019   GFR 85.18 06/30/2019   Lab Results  Component Value Date   CHOL 168 06/22/2019   Lab Results  Component Value Date   HDL 64.50 06/22/2019   Lab Results  Component Value Date   LDLCALC 94 06/22/2019   Lab Results  Component Value Date   TRIG 47.0 06/22/2019   Lab Results  Component Value Date   CHOLHDL 3 06/22/2019   Lab Results  Component Value Date   HGBA1C 6.3 06/22/2019      Assessment & Plan:   Problem List Items Addressed This Visit      Cardiovascular and Mediastinum   Essential hypertension - Primary     Other   Lightheadedness   Chest pressure   Abnormal CT of the head   Hyperlipidemia     His BP is  responding to the losartan twice a day, and he will stay on that.  He is no longer dizzy/lightheaded or having chest pressure or any chest pain.  He has an appt with Dr. 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  Patient advised:  Please monitor your BP at home once a day a few days a week and vary the time. Bring in the BP cuff to check for accuracy with our office equipment. Also, bring in your BP readings notebook.    Office visit for re-check BP and labs in 8 weeks. He will see Cardiology in 4  weeks.   Follow-up: Return in about 8 weeks (around 09/08/2019).   This visit occurred during the SARS-CoV-2 public health emergency.  Safety protocols were in place, including screening questions prior to the visit, additional usage of staff PPE, and extensive cleaning of exam room while observing appropriate contact time as indicated for disinfecting solutions.   Amedeo Kinsman, NP

## 2019-07-14 NOTE — Patient Instructions (Addendum)
Your BP is responding to the losartan twice a day, and stay on that.   Please monitor your BP at home once a day a few days a week and vary the time. Bring in the BP cuff to check for accuracy with our office equipment. Also, bring in your BP readings notebook.   Office visit for re check BP and labs in 8 weeks.     Hypertension, Adult Hypertension is another name for high blood pressure. High blood pressure forces your heart to work harder to pump blood. This can cause problems over time. There are two numbers in a blood pressure reading. There is a top number (systolic) over a bottom number (diastolic). It is best to have a blood pressure that is below 120/80. Healthy choices can help lower your blood pressure, or you may need medicine to help lower it. What are the causes? The cause of this condition is not known. Some conditions may be related to high blood pressure. What increases the risk?  Smoking.  Having type 2 diabetes mellitus, high cholesterol, or both.  Not getting enough exercise or physical activity.  Being overweight.  Having too much fat, sugar, calories, or salt (sodium) in your diet.  Drinking too much alcohol.  Having long-term (chronic) kidney disease.  Having a family history of high blood pressure.  Age. Risk increases with age.  Race. You may be at higher risk if you are African American.  Gender. Men are at higher risk than women before age 35. After age 22, women are at higher risk than men.  Having obstructive sleep apnea.  Stress. What are the signs or symptoms?  High blood pressure may not cause symptoms. Very high blood pressure (hypertensive crisis) may cause: ? Headache. ? Feelings of worry or nervousness (anxiety). ? Shortness of breath. ? Nosebleed. ? A feeling of being sick to your stomach (nausea). ? Throwing up (vomiting). ? Changes in how you see. ? Very bad chest pain. ? Seizures. How is this treated?  This condition is  treated by making healthy lifestyle changes, such as: ? Eating healthy foods. ? Exercising more. ? Drinking less alcohol.  Your health care provider may prescribe medicine if lifestyle changes are not enough to get your blood pressure under control, and if: ? Your top number is above 130. ? Your bottom number is above 80.  Your personal target blood pressure may vary. Follow these instructions at home: Eating and drinking   If told, follow the DASH eating plan. To follow this plan: ? Fill one half of your plate at each meal with fruits and vegetables. ? Fill one fourth of your plate at each meal with whole grains. Whole grains include whole-wheat pasta, brown rice, and whole-grain bread. ? Eat or drink low-fat dairy products, such as skim milk or low-fat yogurt. ? Fill one fourth of your plate at each meal with low-fat (lean) proteins. Low-fat proteins include fish, chicken without skin, eggs, beans, and tofu. ? Avoid fatty meat, cured and processed meat, or chicken with skin. ? Avoid pre-made or processed food.  Eat less than 1,500 mg of salt each day.  Do not drink alcohol if: ? Your doctor tells you not to drink. ? You are pregnant, may be pregnant, or are planning to become pregnant.  If you drink alcohol: ? Limit how much you use to:  0-1 drink a day for women.  0-2 drinks a day for men. ? Be aware of how much alcohol is  in your drink. In the [place] one drink equals one 12 oz bottle of beer (355 mL), one 5 oz glass of wine (148 mL), or one 1 oz glass of hard liquor (44 mL). Lifestyle   Work with your doctor to stay at a healthy weight or to lose weight. Ask your doctor what the best weight is for you.  Get at least 30 minutes of exercise most days of the week. This may include walking, swimming, or biking.  Get at least 30 minutes of exercise that strengthens your muscles (resistance exercise) at least 3 days a week. This may include lifting weights or doing Pilates.   Do not use any products that contain nicotine or tobacco, such as cigarettes, e-cigarettes, and chewing tobacco. If you need help quitting, ask your doctor.  Check your blood pressure at home as told by your doctor.  Keep all follow-up visits as told by your doctor. This is important. Medicines  Take over-the-counter and prescription medicines only as told by your doctor. Follow directions carefully.  Do not skip doses of blood pressure medicine. The medicine does not work as well if you skip doses. Skipping doses also puts you at risk for problems.  Ask your doctor about side effects or reactions to medicines that you should watch for. Contact a doctor if you:  Think you are having a reaction to the medicine you are taking.  Have headaches that keep coming back (recurring).  Feel dizzy.  Have swelling in your ankles.  Have trouble with your vision. Get help right away if you:  Get a very bad headache.  Start to feel mixed up (confused).  Feel weak or numb.  Feel faint.  Have very bad pain in your: ? Chest. ? Belly (abdomen).  Throw up more than once.  Have trouble breathing. Summary  Hypertension is another name for high blood pressure.  High blood pressure forces your heart to work harder to pump blood.  For most people, a normal blood pressure is less than 120/80.  Making healthy choices can help lower blood pressure. If your blood pressure does not get lower with healthy choices, you may need to take medicine. This information is not intended to replace advice given to you by your health care provider. Make sure you discuss any questions you have with your health care provider. Document Revised: 09/04/2017 Document Reviewed: 09/04/2017 Elsevier Patient Education  2020 [employer]  DASH Eating Plan DASH stands for "Dietary Approaches to Stop Hypertension." The DASH eating plan is a healthy eating plan that has been shown to reduce high blood pressure  (hypertension). It may also reduce your risk for type 2 diabetes, heart disease, and stroke. The DASH eating plan may also help with weight loss. What are tips for following this plan?  General guidelines  Avoid eating more than 2,300 mg (milligrams) of salt (sodium) a day. If you have hypertension, you may need to reduce your sodium intake to 1,500 mg a day.  Limit alcohol intake to no more than 1 drink a day for nonpregnant women and 2 drinks a day for men. One drink equals 12 oz of beer, 5 oz of wine, or 1 oz of hard liquor.  Work with your health care provider to maintain a healthy body weight or to lose weight. Ask what an ideal weight is for you.  Get at least 30 minutes of exercise that causes your heart to beat faster (aerobic exercise) most days of the week. Activities may  include walking, swimming, or biking.  Work with your health care provider or diet and nutrition specialist (dietitian) to adjust your eating plan to your individual calorie needs. Reading food labels   Check food labels for the amount of sodium per serving. Choose foods with less than 5 percent of the Daily Value of sodium. Generally, foods with less than 300 mg of sodium per serving fit into this eating plan.  To find whole grains, look for the word "whole" as the first word in the ingredient list. Shopping  Buy products labeled as "low-sodium" or "no salt added."  Buy fresh foods. Avoid canned foods and premade or frozen meals. Cooking  Avoid adding salt when cooking. Use salt-free seasonings or herbs instead of table salt or sea salt. Check with your health care provider or pharmacist before using salt substitutes.  Do not fry foods. Cook foods using healthy methods such as baking, boiling, grilling, and broiling instead.  Cook with heart-healthy oils, such as olive, canola, soybean, or sunflower oil. Meal planning  Eat a balanced diet that includes: ? 5 or more servings of fruits and vegetables  each day. At each meal, try to fill half of your plate with fruits and vegetables. ? Up to 6-8 servings of whole grains each day. ? Less than 6 oz of lean meat, poultry, or fish each day. A 3-oz serving of meat is about the same size as a deck of cards. One egg equals 1 oz. ? 2 servings of low-fat dairy each day. ? A serving of nuts, seeds, or beans 5 times each week. ? Heart-healthy fats. Healthy fats called Omega-3 fatty acids are found in foods such as flaxseeds and coldwater fish, like sardines, salmon, and mackerel.  Limit how much you eat of the following: ? Canned or prepackaged foods. ? Food that is high in trans fat, such as fried foods. ? Food that is high in saturated fat, such as fatty meat. ? Sweets, desserts, sugary drinks, and other foods with added sugar. ? Full-fat dairy products.  Do not salt foods before eating.  Try to eat at least 2 vegetarian meals each week.  Eat more home-cooked food and less restaurant, buffet, and fast food.  When eating at a restaurant, ask that your food be prepared with less salt or no salt, if possible. What foods are recommended? The items listed may not be a complete list. Talk with your dietitian about what dietary choices are best for you. Grains Whole-grain or whole-wheat bread. Whole-grain or whole-wheat pasta. Brown rice. Orpah Cobb. Bulgur. Whole-grain and low-sodium cereals. Pita bread. Low-fat, low-sodium crackers. Whole-wheat flour tortillas. Vegetables Fresh or frozen vegetables (raw, steamed, roasted, or grilled). Low-sodium or reduced-sodium tomato and vegetable juice. Low-sodium or reduced-sodium tomato sauce and tomato paste. Low-sodium or reduced-sodium canned vegetables. Fruits All fresh, dried, or frozen fruit. Canned fruit in natural juice (without added sugar). Meat and other protein foods Skinless chicken or [place]. Ground chicken or [place]. Pork with fat trimmed off. Fish and seafood. Egg whites. Dried beans,  peas, or lentils. Unsalted nuts, nut butters, and seeds. Unsalted canned beans. Lean cuts of beef with fat trimmed off. Low-sodium, lean deli meat. Dairy Low-fat (1%) or fat-free (skim) milk. Fat-free, low-fat, or reduced-fat cheeses. Nonfat, low-sodium ricotta or cottage cheese. Low-fat or nonfat yogurt. Low-fat, low-sodium cheese. Fats and oils Soft margarine without trans fats. Vegetable oil. Low-fat, reduced-fat, or light mayonnaise and salad dressings (reduced-sodium). Canola, safflower, olive, soybean, and sunflower oils. Avocado. Seasoning and  other foods Herbs. Spices. Seasoning mixes without salt. Unsalted popcorn and pretzels. Fat-free sweets. What foods are not recommended? The items listed may not be a complete list. Talk with your dietitian about what dietary choices are best for you. Grains Baked goods made with fat, such as croissants, muffins, or some breads. Dry pasta or rice meal packs. Vegetables Creamed or fried vegetables. Vegetables in a cheese sauce. Regular canned vegetables (not low-sodium or reduced-sodium). Regular canned tomato sauce and paste (not low-sodium or reduced-sodium). Regular tomato and vegetable juice (not low-sodium or reduced-sodium). Rosita Fire. Olives. Fruits Canned fruit in a light or heavy syrup. Fried fruit. Fruit in cream or butter sauce. Meat and other protein foods Fatty cuts of meat. Ribs. Fried meat. Tomasa Blase. Sausage. Bologna and other processed lunch meats. Salami. Fatback. Hotdogs. Bratwurst. Salted nuts and seeds. Canned beans with added salt. Canned or smoked fish. Whole eggs or egg yolks. Chicken or [place] with skin. Dairy Whole or 2% milk, cream, and half-and-half. Whole or full-fat cream cheese. Whole-fat or sweetened yogurt. Full-fat cheese. Nondairy creamers. Whipped toppings. Processed cheese and cheese spreads. Fats and oils Butter. Stick margarine. Lard. Shortening. Ghee. Bacon fat. Tropical oils, such as coconut, palm kernel, or palm  oil. Seasoning and other foods Salted popcorn and pretzels. Onion salt, garlic salt, seasoned salt, table salt, and sea salt. Worcestershire sauce. Tartar sauce. Barbecue sauce. Teriyaki sauce. Soy sauce, including reduced-sodium. Steak sauce. Canned and packaged gravies. Fish sauce. Oyster sauce. Cocktail sauce. Horseradish that you find on the shelf. Ketchup. Mustard. Meat flavorings and tenderizers. Bouillon cubes. Hot sauce and Tabasco sauce. Premade or packaged marinades. Premade or packaged taco seasonings. Relishes. Regular salad dressings. Where to find more information:  National Heart, Lung, and Blood Institute: PopSteam.is  American Heart Association: www.heart.org Summary  The DASH eating plan is a healthy eating plan that has been shown to reduce high blood pressure (hypertension). It may also reduce your risk for type 2 diabetes, heart disease, and stroke.  With the DASH eating plan, you should limit salt (sodium) intake to 2,300 mg a day. If you have hypertension, you may need to reduce your sodium intake to 1,500 mg a day.  When on the DASH eating plan, aim to eat more fresh fruits and vegetables, whole grains, lean proteins, low-fat dairy, and heart-healthy fats.  Work with your health care provider or diet and nutrition specialist (dietitian) to adjust your eating plan to your individual calorie needs. This information is not intended to replace advice given to you by your health care provider. Make sure you discuss any questions you have with your health care provider. Document Revised: 12/07/2016 Document Reviewed: 12/19/2015 Elsevier Patient Education  2020 Elsevier Inc.  High Cholesterol  High cholesterol is a condition in which the blood has high levels of a white, waxy, fat-like substance (cholesterol). The human body needs small amounts of cholesterol. The liver makes all the cholesterol that the body needs. Extra (excess) cholesterol comes from the food that we  eat. Cholesterol is carried from the liver by the blood through the blood vessels. If you have high cholesterol, deposits (plaques) may build up on the walls of your blood vessels (arteries). Plaques make the arteries narrower and stiffer. Cholesterol plaques increase your risk for heart attack and stroke. Work with your health care provider to keep your cholesterol levels in a healthy range. What increases the risk? This condition is more likely to develop in people who:  Eat foods that are high in animal  fat (saturated fat) or cholesterol.  Are overweight.  Are not getting enough exercise.  Have a family history of high cholesterol. What are the signs or symptoms? There are no symptoms of this condition. How is this diagnosed? This condition may be diagnosed from the results of a blood test.  If you are older than age 67, your health care provider may check your cholesterol every 4-6 years.  You may be checked more often if you already have high cholesterol or other risk factors for heart disease. The blood test for cholesterol measures:  "Bad" cholesterol (LDL cholesterol). This is the main type of cholesterol that causes heart disease. The desired level for LDL is less than 100.  "Good" cholesterol (HDL cholesterol). This type helps to protect against heart disease by cleaning the arteries and carrying the LDL away. The desired level for HDL is 60 or higher.  Triglycerides. These are fats that the body can store or burn for energy. The desired number for triglycerides is lower than 150.  Total cholesterol. This is a measure of the total amount of cholesterol in your blood, including LDL cholesterol, HDL cholesterol, and triglycerides. A healthy number is less than 200. How is this treated? This condition is treated with diet changes, lifestyle changes, and medicines. Diet changes  This may include eating more whole grains, fruits, vegetables, nuts, and fish.  This may also  include cutting back on red meat and foods that have a lot of added sugar. Lifestyle changes  Changes may include getting at least 40 minutes of aerobic exercise 3 times a week. Aerobic exercises include walking, biking, and swimming. Aerobic exercise along with a healthy diet can help you maintain a healthy weight.  Changes may also include quitting smoking. Medicines  Medicines are usually given if diet and lifestyle changes have failed to reduce your cholesterol to healthy levels.  Your health care provider may prescribe a statin medicine. Statin medicines have been shown to reduce cholesterol, which can reduce the risk of heart disease. Follow these instructions at home: Eating and drinking If told by your health care provider:  Eat chicken (without skin), fish, veal, shellfish, ground [place] breast, and round or loin cuts of red meat.  Do not eat fried foods or fatty meats, such as hot dogs and salami.  Eat plenty of fruits, such as apples.  Eat plenty of vegetables, such as broccoli, potatoes, and carrots.  Eat beans, peas, and lentils.  Eat grains such as barley, rice, couscous, and bulgur wheat.  Eat pasta without cream sauces.  Use skim or nonfat milk, and eat low-fat or nonfat yogurt and cheeses.  Do not eat or drink whole milk, cream, ice cream, egg yolks, or hard cheeses.  Do not eat stick margarine or tub margarines that contain trans fats (also called partially hydrogenated oils).  Do not eat saturated tropical oils, such as coconut oil and palm oil.  Do not eat cakes, cookies, crackers, or other baked goods that contain trans fats.  General instructions  Exercise as directed by your health care provider. Increase your activity level with activities such as gardening, walking, and taking the stairs.  Take over-the-counter and prescription medicines only as told by your health care provider.  Do not use any products that contain nicotine or tobacco, such as  cigarettes and e-cigarettes. If you need help quitting, ask your health care provider.  Keep all follow-up visits as told by your health care provider. This is important. Contact a health  care provider if:  You are struggling to maintain a healthy diet or weight.  You need help to start on an exercise program.  You need help to stop smoking. Get help right away if:  You have chest pain.  You have trouble breathing. This information is not intended to replace advice given to you by your health care provider. Make sure you discuss any questions you have with your health care provider. Document Revised: 12/28/2016 Document Reviewed: 06/25/2015 Elsevier Patient Education  2020 [employer].

## 2019-07-15 ENCOUNTER — Telehealth: Payer: Self-pay | Admitting: Nurse Practitioner

## 2019-07-15 NOTE — Telephone Encounter (Signed)
The return to work note was completed and ready for pick up.

## 2019-07-15 NOTE — Telephone Encounter (Signed)
Patient dropped off a return to work form. Form is up front in Kim's color folder. Please call when ready, patient stated he can not go back to work without this form completed by his provider.

## 2019-07-15 NOTE — Telephone Encounter (Signed)
Form placed in quick note folder

## 2019-07-15 NOTE — Telephone Encounter (Signed)
Patient aware and papers up front for pickup

## 2019-07-29 ENCOUNTER — Other Ambulatory Visit: Payer: Self-pay | Admitting: Cardiology

## 2019-07-29 DIAGNOSIS — I2510 Atherosclerotic heart disease of native coronary artery without angina pectoris: Secondary | ICD-10-CM

## 2019-07-29 DIAGNOSIS — F1010 Alcohol abuse, uncomplicated: Secondary | ICD-10-CM

## 2019-08-10 ENCOUNTER — Other Ambulatory Visit: Payer: BC Managed Care – PPO

## 2019-08-13 ENCOUNTER — Ambulatory Visit: Payer: BC Managed Care – PPO | Admitting: Cardiology

## 2019-09-02 ENCOUNTER — Other Ambulatory Visit: Payer: BC Managed Care – PPO

## 2019-09-08 ENCOUNTER — Ambulatory Visit: Payer: BC Managed Care – PPO | Admitting: Nurse Practitioner

## 2019-09-11 ENCOUNTER — Encounter: Payer: Self-pay | Admitting: Nurse Practitioner

## 2019-09-11 ENCOUNTER — Other Ambulatory Visit: Payer: Self-pay

## 2019-09-11 ENCOUNTER — Ambulatory Visit (INDEPENDENT_AMBULATORY_CARE_PROVIDER_SITE_OTHER): Payer: BC Managed Care – PPO | Admitting: Nurse Practitioner

## 2019-09-11 ENCOUNTER — Ambulatory Visit: Payer: BC Managed Care – PPO | Admitting: Cardiology

## 2019-09-11 VITALS — BP 148/84 | HR 82 | Temp 98.3°F | Ht 74.0 in | Wt 204.0 lb

## 2019-09-11 DIAGNOSIS — Z789 Other specified health status: Secondary | ICD-10-CM

## 2019-09-11 DIAGNOSIS — R7303 Prediabetes: Secondary | ICD-10-CM

## 2019-09-11 DIAGNOSIS — I10 Essential (primary) hypertension: Secondary | ICD-10-CM

## 2019-09-11 DIAGNOSIS — R930 Abnormal findings on diagnostic imaging of skull and head, not elsewhere classified: Secondary | ICD-10-CM

## 2019-09-11 DIAGNOSIS — E785 Hyperlipidemia, unspecified: Secondary | ICD-10-CM

## 2019-09-11 DIAGNOSIS — Z23 Encounter for immunization: Secondary | ICD-10-CM | POA: Diagnosis not present

## 2019-09-11 DIAGNOSIS — R42 Dizziness and giddiness: Secondary | ICD-10-CM | POA: Diagnosis not present

## 2019-09-11 DIAGNOSIS — F17200 Nicotine dependence, unspecified, uncomplicated: Secondary | ICD-10-CM

## 2019-09-11 MED ORDER — AMLODIPINE BESYLATE 5 MG PO TABS: 10.0000 mg | ORAL_TABLET | Freq: Every day | ORAL | 1 refills | Status: DC

## 2019-09-11 MED ORDER — AMLODIPINE BESYLATE 5 MG PO TABS: 5.0000 mg | ORAL_TABLET | Freq: Every day | ORAL | 1 refills | Status: DC

## 2019-09-11 MED ORDER — LOSARTAN POTASSIUM 50 MG PO TABS: 50.0000 mg | ORAL_TABLET | Freq: Two times a day (BID) | ORAL | 6 refills | Status: DC

## 2019-09-11 NOTE — Progress Notes (Signed)
Established Patient Office Visit  Subjective:  Patient ID: Steven Hill, male    DOB: Jan 11, 1950  Age: 69 y.o. MRN: [id number]  CC:  Chief Complaint  Patient presents with  . 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 He reports his blood pressures have been up and down at home.  He forgot to bring his readings.  He reports has been feeling pretty good.  He has not been back for cardiac testing and follow up. He has not rescheduled his cardiac echo.   He has not seen Neurology, yet about the CT head findings.He was no show to Dr. [name].   CAD involving native coronary artery of native heart without angina pectoris: Reports left heart cath and was told he had nonobstructive CAD with about 50% stenosis.  Does not remember the name of the location of the cardiologist.  He was referred to Dr. Azucena Cecil  on 07/10/2019.  The EKG showed sinus bradycardia heart rate 55, right bundle branch block, left anterior fascicular block.  Blood pressure at the time was 180/90.  He was started on 81 mg aspirin, Lipitor 20 mg daily and ordered an echocardiogram to evaluate systolic and diastolic function.He says he gets lightheaded every now and then but not very often.  He may notice it some when he is at work and it is hot.  He has had no concern about syncope, chest pain, shortness of breath.  HTN: Diagnosed with hypertension in 2012 and recommended medication which did not comply.  He established care with [place] in June.  Maintained on losartan 50 mg twice daily and amlodipine 5 mg daily.  He has shown increases in blood pressure (180/90) and we have considered white coat syndrome.  We have requested that he check his blood pressures at home to give [place] some idea.  Patient reports that he  is getting his blood pressures at home of 145/90 to 130/79, and occasionally  160/90.  But he does skip his medication from time to time so it is difficult to tell.  Neg micro albumin.  BP Readings from Last 3 Encounters:  09/11/19 (!) 148/84  07/14/19 140/64  07/10/19 (!) 180/90   Lab Results  Component Value Date   MICROALBUR <0.7 06/22/2019    HLD/pre DM : Started atorvastatin 20 mg daily and tolerating well without the muscle cramps, leg pain.  Needs  lipid panel to see if he is at goal LDL less than 70.   Lab Results  Component Value Date   CHOL 168 06/22/2019   HDL 64.50 06/22/2019   LDLCALC 94 06/22/2019   TRIG 47.0 06/22/2019   CHOLHDL 3 06/22/2019   Lab Results  Component Value Date   HGBA1C 6.3 06/22/2019   Hip pain: He presents on Mobic but states he is only taking it twice a week.  He has not tried Tylenol.  He has had intermittent chronic hip pain for years, currently not bothering.  He has seen [facility] at hip injection they placed him on Mobic.  Alcohol abuse: He reports he is down to 1 shot daily.  He was advised  to discontinue alcohol altogether as it is contributing to hypertension.  Tobacco use: Daily 50-pack-year history smoking.  Smoking cessation has been advised. He is not interested in  stopping at this time.   Past Medical History:  Diagnosis Date  . Arthritis   . Cardiac arrhythmia due to congenital heart disease    Dx with arrhtymia in his 69's   . Chickenpox   . History of DVT of lower extremity 2012   following knee surgery. Took warfarin for 1 year.   . Phlebitis     Past Surgical History:  Procedure Laterality Date  . KNEE SURGERY Bilateral    Repair of meniscus right  x 2 and left knee meniscus repair.     Family History  Problem Relation Age of Onset  . Heart disease Mother   . Heart disease Father   . Heart disease Sister     Social History   Socioeconomic History  . Marital status: Single    Spouse name: Not on file    . Number of children: Not on file  . Years of education: Not on file  . Highest education level: Some college, no degree  Occupational History  . Occupation: Child psychotherapist  Tobacco Use  . Smoking status: Current Every Day Smoker    Packs/day: 1.00    Years: 50.00    Pack years: 50.00    Types: Cigarettes  . Smokeless tobacco: Never Used  Vaping Use  . Vaping Use: Never used  Substance and Sexual Activity  . Alcohol use: Yes    Alcohol/week: 21.0 standard drinks    Types: 21 Shots of liquor per week  . Drug use: Never  . Sexual activity: Yes  Other Topics Concern  . Not on file  Social History Narrative   Works full time. Lives alone. Divorced. Next of kin - Steven Hill or son - Steven Hill   Social Determinants of Health   Financial Resource Strain:   . Difficulty of Paying Living Expenses: Not on file  Food Insecurity:   . Worried About Programme researcher, broadcasting/film/video in the Last Year: Not on file  . Ran Out of Food in the Last Year: Not on file  Transportation Needs:   . Lack of Transportation (Medical): Not on file  . Lack of Transportation (Non-Medical): Not on file  Physical Activity:   . Days of Exercise per Week: Not on file  . Minutes of Exercise per Session: Not on file  Stress:   . Feeling of Stress : Not on file  Social Connections:   . Frequency of Communication with Friends and Family: Not on file  . Frequency of Social Gatherings with Friends and Family: Not on file  . Attends Religious Services: Not on file  . Active Member of Clubs or Organizations: Not on file  . Attends Banker Meetings: Not on file  . Marital Status: Not on file  Intimate Partner Violence:   . Fear of Current or Ex-Partner: Not on file  . Emotionally Abused: Not on file  . Physically Abused: Not on file  . Sexually Abused: Not on file    Outpatient Medications Prior to Visit  Medication Sig Dispense Refill  . aspirin EC 81 MG tablet Take 1 tablet (81 mg total) by mouth daily.  Swallow whole. 90 tablet 3  . atorvastatin (LIPITOR) 20 MG tablet Take 1 tablet (20 mg total) by mouth daily. 30 tablet 6  . meloxicam (MOBIC) 15 MG tablet Take 15 mg by mouth daily.    Marland Kitchen amLODipine (NORVASC) 5 MG tablet Take 5 mg by mouth daily.    Marland Kitchen losartan (COZAAR) 50 MG tablet Take 1  tablet (50 mg total) by mouth in the morning and at bedtime. 60 tablet 6   No facility-administered medications prior to visit.    No Known Allergies  Review of Systems Pertinent positives noted history of present illness otherwise negative.   Objective:    Physical Exam Vitals reviewed.  Constitutional:      Appearance: Normal appearance. He is normal weight.  HENT:     Head: Normocephalic and atraumatic.  Eyes:     Pupils: Pupils are equal, round, and reactive to light.  Cardiovascular:     Rate and Rhythm: Normal rate and regular rhythm.     Pulses: Normal pulses.     Heart sounds: Normal heart sounds.  Pulmonary:     Effort: Pulmonary effort is normal.     Breath sounds: Normal breath sounds.  Abdominal:     Palpations: Abdomen is soft.     Tenderness: There is no abdominal tenderness.  Musculoskeletal:        General: Normal range of motion.     Cervical back: Normal range of motion and neck supple.  Skin:    General: Skin is warm and dry.  Neurological:     General: No focal deficit present.     Mental Status: He is alert and oriented to person, place, and time.  Psychiatric:        Mood and Affect: Mood normal.        Behavior: Behavior normal.     BP (!) 148/84 (BP Location: Left Arm, Patient Position: Sitting, Cuff Size: Normal)   Pulse 82   Temp 98.3 F (36.8 C) (Oral)   Ht 6\' 2"  (1.88 m)   Wt 204 lb (92.5 kg)   SpO2 98%   BMI 26.19 kg/m  Wt Readings from Last 3 Encounters:  09/11/19 204 lb (92.5 kg)  07/14/19 210 lb (95.3 kg)  07/10/19 215 lb 6 oz (97.7 kg)   Pulse Readings from Last 3 Encounters:  09/11/19 82  07/14/19 68  07/10/19 (!) 55    BP Readings  from Last 3 Encounters:  09/11/19 (!) 148/84  07/14/19 140/64  07/10/19 (!) 180/90    Lab Results  Component Value Date   CHOL 168 06/22/2019   HDL 64.50 06/22/2019   LDLCALC 94 06/22/2019   TRIG 47.0 06/22/2019   CHOLHDL 3 06/22/2019      Health Maintenance Due  Topic Date Due  . COLONOSCOPY  Never done  . PNA vac Low Risk Adult (1 of 2 - PCV13) Never done    There are no preventive care reminders to display for this patient.  Lab Results  Component Value Date   TSH 0.64 06/22/2019   Lab Results  Component Value Date   WBC 6.1 06/09/2019   HGB 15.3 06/22/2019   HCT 44.8 06/09/2019   MCV 85.8 06/09/2019   PLT 160 06/09/2019   Lab Results  Component Value Date   NA 140 06/30/2019   K 3.7 06/30/2019   CO2 26 06/30/2019   GLUCOSE 85 06/30/2019   BUN 15 06/30/2019   CREATININE 1.05 06/30/2019   BILITOT 0.5 06/22/2019   ALKPHOS 76 06/22/2019   AST 14 06/22/2019   ALT 12 06/22/2019   PROT 6.7 06/22/2019   ALBUMIN 4.4 06/22/2019   CALCIUM 9.3 06/30/2019   ANIONGAP 9 06/09/2019   GFR 85.18 06/30/2019   Lab Results  Component Value Date   CHOL 168 06/22/2019   Lab Results  Component Value Date   HDL  64.50 06/22/2019   Lab Results  Component Value Date   LDLCALC 94 06/22/2019   Lab Results  Component Value Date   TRIG 47.0 06/22/2019   Lab Results  Component Value Date   CHOLHDL 3 06/22/2019   Lab Results  Component Value Date   HGBA1C 6.3 06/22/2019      Assessment & Plan:   Problem List Items Addressed This Visit      Cardiovascular and Mediastinum   Essential hypertension - Primary   Relevant Medications   losartan (COZAAR) 50 MG tablet   amLODipine (NORVASC) 5 MG tablet   Other Relevant Orders   Comprehensive metabolic panel   Referral to Chronic Care Management Services     Other   Lightheadedness   Relevant Orders   US Carotid Duplex Bilateral   Abnormal CT of the head   Relevant Orders   Referral to Chronic Care Management  Services   Daily consumption of alcohol   Relevant Orders   Referral to Chronic Care Management Services   Pre-diabetes   Hyperlipidemia   Relevant Medications   losartan (COZAAR) 50 MG tablet   amLODipine (NORVASC) 5 MG tablet   Other Relevant Orders   Lipid panel    Other Visit Diagnoses    Need for immunization against influenza       Relevant Orders   Flu Vaccine QUAD High Dose(Fluad) (Completed)   Tobacco dependence       Need for Tdap vaccination       Relevant Orders   Tdap vaccine greater than or equal to 7yo IM (Completed)      Meds ordered this encounter  Medications  . losartan (COZAAR) 50 MG tablet    Sig: Take 1 tablet (50 mg total) by mouth in the morning and at bedtime.    Dispense:  60 tablet    Refill:  6    Order Specific Question:   Supervising Provider    Answer:   Dale [place] [id number]  . DISCONTD: amLODipine (NORVASC) 5 MG tablet    Sig: Take 1 tablet (5 mg total) by mouth daily.    Dispense:  90 tablet    Refill:  1    Order Specific Question:   Supervising Provider    Answer:   Dale [place] [id number]  . amLODipine (NORVASC) 5 MG tablet    Sig: Take 2 tablets (10 mg total) by mouth daily.    Dispense:  90 tablet    Refill:  1    Order Specific Question:   Supervising Provider    Answer:   Dale Morenci [[id number]]   He received the flu shot and tetanus vaccine today.   His blood pressure is not at goal: Increase amlodipine to 10 mg daily. He is to monitor blood pressure at home and record and bring those in for [place] to see. There has been while variations in his blood pressure from reported at home.  Continue with the losartan 50 mg twice a day and do not change this.    For dizziness at times- I will order carotid ultrasound. Advised to continue to cut back on daily alcohol consumption.See the Cardiologist as planned- call to reschedule the ECHO.  Recheck lipids and if LDL is not less than 70, increase his statin.  He missed  NEUROLOGY appt  with Dr. [name] at [facility]- please call and set up another appt [phone].   Consider low radiation chest CT for smokers.  He declines Chantix for tobacco cessation.  He will work on cutting back cigarette use.  Consider the colonoscopy in the future after cardiology w/up is complete. He is willing to have a colonoscopy.   I have placed a referral into care management team to help [place] with medical compliance.  Phone calls to remind patient about up appointments may be useful.  He lives alone.  He works 1 week on, and 1 week off.   Follow-up: Return in about 3 weeks (around 10/02/2019).   This visit occurred during the SARS-CoV-2 public health emergency.  Safety protocols were in place, including screening questions prior to the visit, additional usage of staff PPE, and extensive cleaning of exam room while observing appropriate contact time as indicated for disinfecting solutions.    Amedeo Kinsman, NP

## 2019-09-11 NOTE — Patient Instructions (Addendum)
You have received a flu shot today.  You have received a tetanus vaccine today.  Your blood pressure is not quite at goal.   1).  Increase the amlodipine from 5 mg to 10 mg in the morning.  You may take two of the 5 mg tablets in the morning to equal 10 mg.  Please check your blood pressure once a day over the next 5 days and call us the report on Tuesday morning.  If the blood pressure gets too low (less than 110/70) , or makes you feel dizzy or lightheaded write that down, check your pressure, and we can discuss this.  2). Continue with the losartan 50 mg twice a day and do not change this.   3). For your dizziness at time- I will order carotid ultrasound.   4). See the Cardiologist as planned.   5). You missed your NEUROLOGY appt with Dr. [name] at [facility]- please call and set up another appt because of your abnormal brain MRI. [phone].   6). Please see me in 3 weeks.

## 2019-09-13 ENCOUNTER — Telehealth: Payer: Self-pay | Admitting: Nurse Practitioner

## 2019-09-13 ENCOUNTER — Encounter: Payer: Self-pay | Admitting: Nurse Practitioner

## 2019-09-13 NOTE — Telephone Encounter (Signed)
Can you assist him in getting the appointments for:  1. His echocardiogram.  2. His follow-up with Dr. Azucena Cecil in Cardiology  3. [facility]  He works 1 week on and 1 week off. So, he will have to schedule on the week off.

## 2019-09-15 ENCOUNTER — Telehealth: Payer: Self-pay | Admitting: Nurse Practitioner

## 2019-09-15 ENCOUNTER — Telehealth: Payer: Self-pay

## 2019-09-15 NOTE — Telephone Encounter (Signed)
Tried to call patient but no answer and no VM

## 2019-09-15 NOTE — Telephone Encounter (Signed)
Pt is scheduled on 09/22/2019 at 10:30 am with cardio and pt is scheduled for echo on the same day. Pt had his appt at [facility] neurology on 07/29/2019 at 09:45 am.

## 2019-09-15 NOTE — Telephone Encounter (Signed)
I will give pt the number to call and schedule. Sure

## 2019-09-15 NOTE — Telephone Encounter (Signed)
Let him know - very good.

## 2019-09-15 NOTE — Telephone Encounter (Signed)
Pt called to give BP readings   9/3  122/70 9/4  120/78 9/5  136/90 9/6  134/85 9/7  124/79

## 2019-09-15 NOTE — Telephone Encounter (Signed)
He was no show at [facility] Neurology- [name]. Can you get him another appt?

## 2019-09-15 NOTE — Telephone Encounter (Signed)
Patient aware.

## 2019-09-15 NOTE — Chronic Care Management (AMB) (Signed)
  Care Management   Outreach Note  09/15/2019 Name: Steven Hill MRN: [id number] DOB: 1950/07/14  Referred by: Theadore Nan, NP Reason for referral : Care Coordination (outreach to schedule referral for Pharm D )   An unsuccessful telephone outreach was attempted today. The patient was referred to the case management team for assistance with care management and care coordination.   Follow Up Plan: A HIPPA compliant phone message was left for the patient providing contact information and requesting a return call.  The care management team will reach out to the patient again over the next 5 days.  If patient returns call to provider office, please advise to call Embedded Care Management Care Guide Penne Lash  at [phone]  Penne Lash, RMA Care Guide, Embedded Care Coordination [facility]  [place], [place] [address] Direct Dial: [phone] [email or web address] Website: [email or web address]

## 2019-09-17 NOTE — Chronic Care Management (AMB) (Signed)
  Care Management   Outreach Note  09/17/2019 Name: Steven Hill MRN: [id number] DOB: [birth year]  Referred by: Theadore Nan, NP Reason for referral : Care Coordination (outreach to schedule referral for Pharm D ) and Care Coordination (Second outreach to schedule referral for Pharm D )   A second unsuccessful telephone outreach was attempted today. The patient was referred to the case management team for assistance with care management and care coordination.   Follow Up Plan: The care management team will reach out to the patient again over the next 7 days.  If patient returns call to provider office, please advise to call Embedded Care Management Care Guide Penne Lash at [phone]  Penne Lash, RMA Care Guide, Embedded Care Coordination [facility]  [place], [place] [address] Direct Dial: [phone] [name] Website: [email or web address]

## 2019-09-22 ENCOUNTER — Other Ambulatory Visit: Payer: BC Managed Care – PPO

## 2019-09-23 NOTE — Chronic Care Management (AMB) (Signed)
  Care Management   Outreach Note  09/23/2019 Name: Steven Hill MRN: [id number] DOB: 1950-08-19  Referred by: Theadore Nan, NP Reason for referral : Care Coordination (outreach to schedule referral for Pharm D ), Care Coordination (Second outreach to schedule referral for Pharm D ), and Care Coordination (Third outreach to schedule referral for Pharm D )   Third unsuccessful telephone outreach was attempted today. The patient was referred to the case management team for assistance with care management and care coordination. The patient's primary care provider has been notified of our unsuccessful attempts to make or maintain contact with the patient. The care management team is pleased to engage with this patient at any time in the future should he/she be interested in assistance from the care management team.   Follow Up Plan: We have been unable to make contact with the patient for follow up. The care management team is available to follow up with the patient after provider conversation with the patient regarding recommendation for care management engagement and subsequent re-referral to the care management team.   Penne Lash, RMA Care Guide, Embedded Care Coordination [facility]  [place], [place] [address] Direct Dial: [phone] [email or web address] Website: [email or web address]

## 2019-09-28 ENCOUNTER — Telehealth: Payer: Self-pay

## 2019-09-28 NOTE — Chronic Care Management (AMB) (Signed)
  Steven Management   Note  09/28/2019 Name: Steven Hill MRN: [id number] DOB: 28-Jun-1950  Steven Hill is a 69 y.o. year old male who is a primary Steven patient of Theadore Nan, NP. I reached out to [employer] by phone today in response to a referral sent by Steven Hill health plan.    Steven Hill was given information about Steven management services today including:  1. Steven management services include personalized support from designated clinical staff supervised by his physician, including individualized plan of Steven and coordination with other Steven providers 2. 24/7 contact phone numbers for assistance for urgent and routine Steven needs. 3. The patient may stop Steven management services at any time by phone call to the office staff.  Patient agreed to services and verbal consent obtained.   Follow up plan: Telephone appointment with Steven management team member scheduled for: Steven Hill 10/16/2019  Steven Hill, Steven Hill Steven Hill, Embedded Steven Coordination [facility]  [place], [place] [address] Direct Dial: [phone] [email or web address] Website: [email or web address]

## 2019-09-28 NOTE — Telephone Encounter (Signed)
Today was the 4th unsuccesful outreach. Please let me know if there is anything else I can do to assist with this pt.  Thank you  Penne Lash, RMA Care Guide, Embedded Care Coordination [facility]  [place], [place] [address] Direct Dial: [phone] [email or web address] Website: [email or web address]

## 2019-09-28 NOTE — Chronic Care Management (AMB) (Signed)
  Care Management   Outreach Note  09/28/2019 Name: Steven Hill MRN: [id number] DOB: [birth year]  Referred by: Theadore Nan, NP Reason for referral : Care Coordination (fourth outreach to schedule referral for Pharm D )   A fourth unsuccessful telephone outreach was attempted today. The patient was referred to the case management team for assistance with care management and care coordination.   Follow Up Plan: A HIPAA compliant phone message was left for the patient providing contact information and requesting a return call.  We have been unable to make contact with the patient for Initial with Pharm D . The care management team is available to follow up with the patient after provider conversation with the patient regarding recommendation for care management engagement and subsequent re-referral to the care management team.  If patient returns call to provider office, please advise to call Embedded Care Management Care Guide Penne Lash at [phone]  Penne Lash, RMA Care Guide, Embedded Care Coordination [facility]  [place], [place] [address] Direct Dial: [phone] [email or web address]: [email or web address]

## 2019-10-01 ENCOUNTER — Ambulatory Visit: Payer: BC Managed Care – PPO | Admitting: Cardiology

## 2019-10-16 ENCOUNTER — Ambulatory Visit: Payer: BC Managed Care – PPO | Admitting: Pharmacist

## 2019-10-16 DIAGNOSIS — E785 Hyperlipidemia, unspecified: Secondary | ICD-10-CM

## 2019-10-16 DIAGNOSIS — F17200 Nicotine dependence, unspecified, uncomplicated: Secondary | ICD-10-CM

## 2019-10-16 DIAGNOSIS — I10 Essential (primary) hypertension: Secondary | ICD-10-CM

## 2019-10-16 MED ORDER — VARENICLINE TARTRATE 1 MG PO TABS: ORAL_TABLET | ORAL | 5 refills | Status: DC

## 2019-10-16 NOTE — Chronic Care Management (AMB) (Signed)
Chronic Care Management   Note  10/16/2019 Name: Steven Hill MRN: [id number] DOB: 08-Oct-1950   Subjective:  Steven Hill is a 69 y.o. year old male who is a primary care patient of Theadore Nan, NP. The CCM team was consulted for assistance with chronic disease management and care coordination needs.     Contacted patient for initial medication access and medication management support  Steven Hill was given information about Chronic Care Management services today including:  1. CCM service includes personalized support from designated clinical staff supervised by his physician, including individualized plan of care and coordination with other care providers 2. 24/7 contact phone numbers for assistance for urgent and routine care needs. 3. The patient may stop CCM services at any time (effective at the end of the month) by phone call to the office staff.  Patient agreed to services and verbal consent obtained.   Review of patient status, including review of consultants reports, laboratory and other test data, was performed as part of comprehensive evaluation and provision of chronic care management services.   SDOH (Social Determinants of Health) assessments and interventions performed:  SDOH Interventions     Most Recent Value  SDOH Interventions  SDOH Interventions for the Following Domains Tobacco  Tobacco Interventions Cessation Materials Given and Reviewed       Objective:  Lab Results  Component Value Date   CREATININE 1.05 06/30/2019   CREATININE 0.94 06/22/2019   CREATININE 0.95 06/09/2019    Lab Results  Component Value Date   HGBA1C 6.3 06/22/2019       Component Value Date/Time   CHOL 168 06/22/2019 1025   TRIG 47.0 06/22/2019 1025   HDL 64.50 06/22/2019 1025   CHOLHDL 3 06/22/2019 1025   VLDL 9.4 06/22/2019 1025   LDLCALC 94 06/22/2019 1025    Clinical ASCVD: Yes  The 10-year ASCVD risk score Denman George DC Jr., et al., 2013) is: 32.9%   Values  used to calculate the score:     Age: 47 years     Sex: Male     Is Non-Hispanic African American: Yes     Diabetic: No     Tobacco smoker: Yes     Systolic Blood Pressure: 152 mmHg     Is BP treated: Yes     HDL Cholesterol: 64.5 mg/dL     Total Cholesterol: 168 mg/dL    BP Readings from Last 3 Encounters:  09/11/19 (!) 148/84  07/14/19 140/64  07/10/19 (!) 180/90    No Known Allergies  Medications Reviewed Today    Reviewed by Lourena Simmonds, RPH-CPP (Pharmacist) on 10/16/19 at 1320  Med List Status: <None>  Medication Order Taking? Sig Documenting Provider Last Dose Status Informant  amLODipine (NORVASC) 5 MG tablet [id number] Yes Take 2 tablets (10 mg total) by mouth daily. Theadore Nan, NP Taking Active   aspirin EC 81 MG tablet [id number] Yes Take 1 tablet (81 mg total) by mouth daily. Swallow whole. Debbe Odea, MD Taking Active   atorvastatin (LIPITOR) 20 MG tablet [id number] Yes Take 1 tablet (20 mg total) by mouth daily. Debbe Odea, MD Taking Active   losartan (COZAAR) 50 MG tablet [id number] Yes Take 1 tablet (50 mg total) by mouth in the morning and at bedtime. Theadore Nan, NP Taking Active   meloxicam (MOBIC) 15 MG tablet [id number] Yes Take 15 mg by mouth daily. [provider] Taking Active   Multiple Vitamin (MULTIVITAMIN) tablet [id number] Yes Take 1 tablet by  mouth daily. [provider] Taking Active            Assessment:   Goals Addressed              This Visit's Progress     Patient Stated     PharmD "I want to make sure I keep everything straight" (pt-stated)        CARE PLAN ENTRY (see longitudinal plan of care for additional care plan information)  Current Barriers:   Social, financial, community barriers:  o Denies any at this time. History of not staying adherent to medications.   Tobacco abuse of 50+ years, health complicated by chronic medical conditions including HTN, HLD, hx DVT 2012  associated with knee surgery, nonobstructive CAD; currently smoking 1/2 ppd. Patient notes today that his biggest health goal is quitting smoking o Previous quit attempts, cold [place], did not remain abstinent  o Reports smoking within 30 minutes of waking up o Reports triggers to smoke include: AM coffee, alcohol, after meals o Reports motivation to quit smoking includes: his health  Hypertension: losartan 50 mg BID, amlodipine 10 mg daily o Home reading 120-130/80s  HLD: atorvastatin 20 mg daily  o Last LDL at goal <100  ASCVD risk reduction: ASA 81 mg daily  Arthritis: meloxicam 15 mg QAM  Pharmacist Clinical Goal(s):   Over the next 90 days, patient will work with PharmD and provider towards tobacco cessation  Over the next 90 days, patient will work with PharmD and provider towards goal BP <130/80  Interventions:  Comprehensive medication review performed, medication list in electronic medical record updated  Inter-disciplinary care team collaboration (see longitudinal plan of care)  Discussed patient's goal of tobacco cessation. Discussed benefit of pharmacotherapy and CBT. He is amenable to pharmacotherapy. Discussed varenicline vs dual nicotine replacement therapy. Patient elects to start varenicline. Discussed w/ PCP Fransico Setters; she is in agreement.   Start varenicline 0.5 mg by mouth once daily with food x3 days, then 0.5 mg by mouth twice daily with food x4 days, then 1 mg by mouth twice daily with food thereafter. Patient counseled on purpose, proper use, and potential adverse effects, including GI upset.  Provided contact information for [place] Quit Line (1-800-QUIT-NOW). Patient will outreach this group for support. Discussed other activities that he can do to prevent smoking - he elects to go to the gym more often.  Discussed goal BP <130/80. Recommend he continued current therapy  Patient Self Care Activities:   Patient will go to the gym as a habit during cravings.     Patient will commit to tobacco cessation  Patient will take medications as prescribed   Initial goal documentation        Plan: - Scheduled f/u call in 4 weeks  Catie Feliz Beam, PharmD, [place], CPP Clinical Pharmacist [facility] [employer] [phone]

## 2019-10-16 NOTE — Patient Instructions (Signed)
Mr. Steven Hill,   It was great talking with you today!  Start varenicline (generic Chantix). Take 1/2 of a tablet once daily for 3 days. Then, increase to 1/2 of a tablet twice daily for 4 days. Then, increase to 1 tablet twice daily. Take this medication with food. The goal is to quit smoking within the first 8-30 days of being on varenicline.   Brainstorm things you can do with to occupy your hands and distract your brain during the times you normally smoke. I've included a long (somewhat silly) list of ideas! I also recommend you call the [place] Quitline. Their number is 1-800-QUIT-NOW. They can help support you with the behavioral aspects of quitting smoking. Their flyer is enclosed.   Our goal blood pressure is less than 130/80. Please check your blood pressure a couple of times a week  Our goal bad cholesterol, or LDL, is less than 100 . This is why it is important to continue taking your simvastatin  Feel free to call me with any questions or concerns. I look forward to our next visit!  Catie Feliz Beam, PharmD, [place], CPP Direct line: [phone] Clinic phone: [phone]   Visit Information  Goals Addressed              This Visit's Progress     Patient Stated   .  PharmD "I want to make sure I keep everything straight" (pt-stated)        CARE PLAN ENTRY (see longitudinal plan of care for additional care plan information)  Current Barriers:  . Social, financial, community barriers:  o Denies any at this time. History of not staying adherent to medications.  . Tobacco abuse of 50+ years, health complicated by chronic medical conditions including HTN, HLD, hx DVT 2012 associated with knee surgery, nonobstructive CAD; currently smoking 1/2 ppd. Patient notes today that his biggest health goal is quitting smoking o Previous quit attempts, cold [place], did not remain abstinent  o Reports smoking within 30 minutes of waking up o Reports triggers to smoke include: AM coffee, alcohol,  after meals o Reports motivation to quit smoking includes: his health . Hypertension: losartan 50 mg BID, amlodipine 10 mg daily o Home reading 120-130/80s . HLD: atorvastatin 20 mg daily  o Last LDL at goal <100 . ASCVD risk reduction: ASA 81 mg daily . Arthritis: meloxicam 15 mg QAM  Pharmacist Clinical Goal(s):  Marland Kitchen Over the next 90 days, patient will work with PharmD and provider towards tobacco cessation . Over the next 90 days, patient will work with PharmD and provider towards goal BP <130/80  Interventions: . Comprehensive medication review performed, medication list in electronic medical record updated . Inter-disciplinary care team collaboration (see longitudinal plan of care) . Discussed patient's goal of tobacco cessation. Discussed benefit of pharmacotherapy and CBT. He is amenable to pharmacotherapy. Discussed varenicline vs dual nicotine replacement therapy. Patient elects to start varenicline. Discussed w/ PCP Fransico Setters; she is in agreement.  . Start varenicline 0.5 mg by mouth once daily with food x3 days, then 0.5 mg by mouth twice daily with food x4 days, then 1 mg by mouth twice daily with food thereafter. Patient counseled on purpose, proper use, and potential adverse effects, including GI upset. . Provided contact information for [place] Quit Line (1-800-QUIT-NOW). Patient will outreach this group for support. Discussed other activities that he can do to prevent smoking - he elects to go to the gym more often. . Discussed goal BP <130/80. Recommend he continued  current therapy  Patient Self Care Activities:  . Patient will go to the gym as a habit during cravings.  . Patient will commit to tobacco cessation . Patient will take medications as prescribed   Initial goal documentation        Mr. Harbin was given information about Chronic Care Management services today including:  1. CCM service includes personalized support from designated clinical staff supervised by his  physician, including individualized plan of care and coordination with other care providers 2. 24/7 contact phone numbers for assistance for urgent and routine care needs. 3. The patient may stop CCM services at any time (effective at the end of the month) by phone call to the office staff.  Patient agreed to services and verbal consent obtained.   The patient verbalized understanding of instructions provided today and agreed to receive a mailed copy of patient instruction and/or educational materials.  Plan: - Scheduled f/u call in 4 weeks  Catie Feliz Beam, PharmD, [place], CPP Clinical Pharmacist [facility] [employer] [phone]

## 2019-10-26 ENCOUNTER — Ambulatory Visit (INDEPENDENT_AMBULATORY_CARE_PROVIDER_SITE_OTHER): Payer: BC Managed Care – PPO

## 2019-10-26 ENCOUNTER — Other Ambulatory Visit: Payer: Self-pay

## 2019-10-26 DIAGNOSIS — F1010 Alcohol abuse, uncomplicated: Secondary | ICD-10-CM | POA: Diagnosis not present

## 2019-10-26 DIAGNOSIS — I2510 Atherosclerotic heart disease of native coronary artery without angina pectoris: Secondary | ICD-10-CM

## 2019-10-28 LAB — ECHOCARDIOGRAM COMPLETE
AR max vel: 2.88 cm2
AV Area VTI: 2.93 cm2
AV Area mean vel: 2.78 cm2
AV Mean grad: 5 mmHg
AV Peak grad: 10.2 mmHg
Ao pk vel: 1.6 m/s
Area-P 1/2: 3.03 cm2
Calc EF: 48.3 %
S' Lateral: 3.5 cm
Single Plane A2C EF: 51.3 %
Single Plane A4C EF: 48.5 %

## 2019-10-30 ENCOUNTER — Ambulatory Visit: Payer: BC Managed Care – PPO | Admitting: Cardiology

## 2019-11-02 ENCOUNTER — Other Ambulatory Visit: Payer: Self-pay

## 2019-11-02 ENCOUNTER — Ambulatory Visit (INDEPENDENT_AMBULATORY_CARE_PROVIDER_SITE_OTHER): Payer: BC Managed Care – PPO | Admitting: Cardiology

## 2019-11-02 ENCOUNTER — Encounter: Payer: Self-pay | Admitting: Cardiology

## 2019-11-02 VITALS — BP 134/80 | HR 63 | Ht 74.0 in | Wt 205.2 lb

## 2019-11-02 DIAGNOSIS — I2510 Atherosclerotic heart disease of native coronary artery without angina pectoris: Secondary | ICD-10-CM | POA: Diagnosis not present

## 2019-11-02 DIAGNOSIS — I10 Essential (primary) hypertension: Secondary | ICD-10-CM | POA: Diagnosis not present

## 2019-11-02 DIAGNOSIS — F17200 Nicotine dependence, unspecified, uncomplicated: Secondary | ICD-10-CM | POA: Diagnosis not present

## 2019-11-02 DIAGNOSIS — I77810 Thoracic aortic ectasia: Secondary | ICD-10-CM

## 2019-11-02 DIAGNOSIS — Z13220 Encounter for screening for lipoid disorders: Secondary | ICD-10-CM

## 2019-11-02 MED ORDER — LOSARTAN POTASSIUM 100 MG PO TABS: 100.0000 mg | ORAL_TABLET | Freq: Every day | ORAL | 2 refills | Status: AC

## 2019-11-02 NOTE — Patient Instructions (Signed)
Medication Instructions:  Your physician has recommended you make the following change in your medication:  1- TAKE Losartan 100 mg by mouth once a day.  *If you need a refill on your cardiac medications before your next appointment, please call your pharmacy*  Lab Work: Your physician recommends that you return for lab work in: 6 MONTHS - AROUND May 02, 2020. - Will check cholesterol. - You will need to be fasting. Please do not have anything to eat or drink after midnight the morning you have the lab work. You may only have water or black coffee with no cream or sugar. - Please go to the [facility]. You will check in at the front desk to the right as you walk into the atrium. Valet Parking is offered if needed. - No appointment needed. You may go any day between 7 am and 6 pm.  If you have labs (blood work) drawn today and your tests are completely normal, you will receive your results only by: Marland Kitchen MyChart Message (if you have MyChart) OR . A paper copy in the mail If you have any lab test that is abnormal or we need to change your treatment, we will call you to review the results.   Testing/Procedures: none  Follow-Up: At [facility], you and your health needs are our priority.  As part of our continuing mission to provide you with exceptional heart care, we have created designated Provider Care Teams.  These Care Teams include your primary Cardiologist (physician) and Advanced Practice Providers (APPs -  Physician Assistants and Nurse Practitioners) who all work together to provide you with the care you need, when you need it.  We recommend signing up for the patient portal called "MyChart".  Sign up information is provided on this After Visit Summary.  MyChart is used to connect with patients for Virtual Visits (Telemedicine).  Patients are able to view lab/test results, encounter notes, upcoming appointments, etc.  Non-urgent messages can be sent to your provider as well.    To learn more about what you can do with MyChart, go to [email or web address].    Your next appointment:   6 month(s) - Please make sure you have had your lab work sometime within the week before the follow up appointment.   The format for your next appointment:   In Person  Provider:   You may see Debbe Odea, MD or one of the following Advanced Practice Providers on your designated Care Team:    Nicolasa Ducking, NP  Eula Listen, PA-C  Marisue Ivan, PA-C  Cadence Hall, [place]

## 2019-11-02 NOTE — Progress Notes (Signed)
Cardiology Office Note:    Date:  11/02/2019   ID:  Steven Hill, DOB 1950-04-15, MRN [id number]  PCP:  Theadore Nan, NP  [facility] Cardiologist:  Debbe Odea, MD  [facility] Electrophysiologist:  None   Referring MD: Theadore Nan, NP   Chief Complaint  Patient presents with   OTHER    4 wk f/u echo no complaints today. Meds reviewed verbally with pt.    History of Present Illness:    Steven Hill is a 69 y.o. male with a hx of hypertension, arthritis, DVT 2012 associated with knee surgery, nonobstructive CAD (lhc in the 90s wutg 50% stenosis), current smoker x50+ years, who presents for follow-up.  Previously seen due to history of CAD and hypertension.  Losartan was increased after last visit.  Alcohol cessation/reduction recommended.  States doing okay, denies any symptoms of chest pain or shortness of breath.  Echocardiogram was ordered to evaluate cardiac function in light of previous CAD.  Prior notes He had nonspecific chest discomfort in the 90s when he was in [place].  He had a left heart cath and was told he has nonobstructive CAD with about 50% stenosis.  Does not remember the name or location of cardiologist.  Does not take aspirin.  He drinks about 2 shots of vodka daily for the years now.  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  Past Medical History:  Diagnosis Date   Arthritis    Cardiac arrhythmia due to congenital heart disease    Dx with arrhtymia in his 20's    Chickenpox    History of DVT of lower extremity 2012   following knee surgery. Took warfarin for 1 year.    Phlebitis     Past Surgical History:  Procedure Laterality Date   KNEE SURGERY Bilateral    Repair of meniscus right  x 2 and left knee meniscus repair.     Current Medications: Current Meds  Medication Sig   amLODipine (NORVASC) 5 MG tablet Take 2 tablets (10 mg total) by mouth daily.   aspirin EC 81 MG tablet Take 1  tablet (81 mg total) by mouth daily. Swallow whole.   atorvastatin (LIPITOR) 20 MG tablet Take 1 tablet (20 mg total) by mouth daily.   meloxicam (MOBIC) 15 MG tablet Take 15 mg by mouth daily as needed.    Multiple Vitamin (MULTIVITAMIN) tablet Take 1 tablet by mouth daily.   [DISCONTINUED] losartan (COZAAR) 50 MG tablet Take 1 tablet (50 mg total) by mouth in the morning and at bedtime.     Allergies:   Patient has no known allergies.   Social History   Socioeconomic History   Marital status: Single    Spouse name: Not on file   Number of children: Not on file   Years of education: Not on file   Highest education level: Some college, no degree  Occupational History   Occupation: Child psychotherapist  Tobacco Use   Smoking status: Current Every Day Smoker    Packs/day: 0.50    Years: 50.00    Pack years: 25.00    Types: Cigarettes   Smokeless tobacco: Never Used  Vaping Use   Vaping Use: Never used  Substance and Sexual Activity   Alcohol use: Yes    Alcohol/week: 21.0 standard drinks    Types: 21 Shots of liquor per week    Comment: reports dwon from 21 shots per week to 7 shohts per  week   Drug use: Never   Sexual activity: Yes  Other Topics Concern   Not on file  Social History Narrative   Works full time. Lives alone. Divorced. Next of kin - [name] or son - [name]   Social Determinants of Health   Financial Resource Strain:    Difficulty of Paying Living Expenses: Not on file  Food Insecurity:    Worried About Programme researcher, broadcasting/film/video in the Last Year: Not on file   [employer] of Food in the Last Year: Not on file  Transportation Needs:    Lack of Transportation (Medical): Not on file   Lack of Transportation (Non-Medical): Not on file  Physical Activity: Insufficiently Active   Days of Exercise per Week: 3 days   Minutes of Exercise per Session: 10 min  Stress:    Feeling of Stress : Not on file  Social Connections:    Frequency of  Communication with Friends and Family: Not on file   Frequency of Social Gatherings with Friends and Family: Not on file   Attends Religious Services: Not on file   Active Member of Clubs or Organizations: Not on file   Attends Banker Meetings: Not on file   Marital Status: Not on file     Family History: The patient's family history includes Heart disease in his father, mother, and sister.  ROS:   Please see the history of present illness.     All other systems reviewed and are negative.  EKGs/Labs/Other Studies Reviewed:    The following studies were reviewed today:   EKG:  EKG is  ordered today.  The ekg ordered today demonstrates normal sinus rhythm. Recent Labs: 06/09/2019: Platelets 160 06/22/2019: ALT 12; Hemoglobin 15.3; TSH 0.64 06/30/2019: BUN 15; Creatinine, Ser 1.05; Potassium 3.7; Sodium 140  Recent Lipid Panel    Component Value Date/Time   CHOL 168 06/22/2019 1025   TRIG 47.0 06/22/2019 1025   HDL 64.50 06/22/2019 1025   CHOLHDL 3 06/22/2019 1025   VLDL 9.4 06/22/2019 1025   LDLCALC 94 06/22/2019 1025    Physical Exam:    VS:  BP 134/80 (BP Location: Left Arm, Patient Position: Sitting, Cuff Size: Normal)    Pulse 63    Ht 6\' 2"  (1.88 m)    Wt 205 lb 4 oz (93.1 kg)    SpO2 99%    BMI 26.35 kg/m     Wt Readings from Last 3 Encounters:  11/02/19 205 lb 4 oz (93.1 kg)  09/11/19 204 lb (92.5 kg)  07/14/19 210 lb (95.3 kg)     GEN:  Well nourished, well developed in no acute distress HEENT: Normal NECK: No JVD; No carotid bruits LYMPHATICS: No lymphadenopathy CARDIAC: RRR, no murmurs, rubs, gallops RESPIRATORY:  Clear to auscultation without rales, wheezing or rhonchi  ABDOMEN: Soft, non-tender, non-distended MUSCULOSKELETAL:  No edema; No deformity  SKIN: Warm and dry NEUROLOGIC:  Alert and oriented x 3 PSYCHIATRIC:  Normal affect   ASSESSMENT:    1. Coronary artery disease involving native coronary artery of native heart without  angina pectoris   2. Ascending aorta dilatation (HCC)   3. Essential hypertension   4. Smoking   5. Screening for cholesterol level    PLAN:    In order of problems listed above:  1. hx of nonobstructive CAD. denies any symptoms of chest pain or shortness of breath.  Continue aspirin 81, Lipitor 20 mg.  Echocardiogram showed normal systolic and diastolic function,  EF 50 to 55%. 2. Moderate ascending aorta dilation, measuring 45 mm.  Repeat echocardiogram in 1 year.  Aggressive management of risk factors especially hypertension, smoking cessation, reduction of alcohol intake recommended. 3. History of hypertension, blood pressure improved.  Continue losartan, consolidate to 100 mg daily.  Continue amlodipine 5 mg daily. 4. He is a current smoker x50 years.  Smoking cessation advised.  Over 5 minutes spent counseling patient.  Follow-up in 6 months, check lipid panel prior to follow-up visit.  This note was generated in part or whole with voice recognition software. Voice recognition is usually quite accurate but there are transcription errors that can and very often do occur. I apologize for any typographical errors that were not detected and corrected.  Medication Adjustments/Labs and Tests Ordered: Current medicines are reviewed at length with the patient today.  Concerns regarding medicines are outlined above.  Orders Placed This Encounter  Procedures   Lipid panel   EKG 12-Lead   Meds ordered this encounter  Medications   losartan (COZAAR) 100 MG tablet    Sig: Take 1 tablet (100 mg total) by mouth daily.    Dispense:  90 tablet    Refill:  2    Patient Instructions  Medication Instructions:  Your physician has recommended you make the following change in your medication:  1- TAKE Losartan 100 mg by mouth once a day.  *If you need a refill on your cardiac medications before your next appointment, please call your pharmacy*  Lab Work: Your physician recommends that you  return for lab work in: 6 MONTHS - AROUND May 02, 2020. - Will check cholesterol. - You will need to be fasting. Please do not have anything to eat or drink after midnight the morning you have the lab work. You may only have water or black coffee with no cream or sugar. - Please go to the [facility]. You will check in at the front desk to the right as you walk into the atrium. Valet Parking is offered if needed. - No appointment needed. You may go any day between 7 am and 6 pm.  If you have labs (blood work) drawn today and your tests are completely normal, you will receive your results only by:  MyChart Message (if you have MyChart) OR  A paper copy in the mail If you have any lab test that is abnormal or we need to change your treatment, we will call you to review the results.   Testing/Procedures: none  Follow-Up: At [facility], you and your health needs are our priority.  As part of our continuing mission to provide you with exceptional heart care, we have created designated Provider Care Teams.  These Care Teams include your primary Cardiologist (physician) and Advanced Practice Providers (APPs -  Physician Assistants and Nurse Practitioners) who all work together to provide you with the care you need, when you need it.  We recommend signing up for the patient portal called "MyChart".  Sign up information is provided on this After Visit Summary.  MyChart is used to connect with patients for Virtual Visits (Telemedicine).  Patients are able to view lab/test results, encounter notes, upcoming appointments, etc.  Non-urgent messages can be sent to your provider as well.   To learn more about what you can do with MyChart, go to [email or web address].    Your next appointment:   6 month(s) - Please make sure you have had your lab work sometime within the week before  the follow up appointment.   The format for your next appointment:   In Person  Provider:   You may see  Debbe OdeaBrian Agbor-Etang, MD or one of the following Advanced Practice Providers on your designated Care Team:    Nicolasa Duckinghristopher Berge, NP  Eula Listenyan Dunn, PA-C  Marisue IvanJacquelyn Visser, PA-C  Cadence [place]un[place]Fur[place], [place]      Signed, Debbe OdeaBrian Agbor-Etang, MD  11/02/2019 12:37 PM    [facility]

## 2019-11-03 ENCOUNTER — Telehealth: Payer: Self-pay | Admitting: *Deleted

## 2019-11-03 NOTE — Telephone Encounter (Signed)
-----   Message from Debbe Odea, MD sent at 10/28/2019  7:09 PM EDT ----- Normal systolic function, mild aortic root dilatation, moderate ascending aorta dilatation.  Keep follow-up appointment.  Continue current medications.

## 2019-11-03 NOTE — Telephone Encounter (Signed)
Call can not be completed at this time.  °

## 2019-11-09 ENCOUNTER — Telehealth: Payer: Self-pay

## 2019-11-09 DIAGNOSIS — F17200 Nicotine dependence, unspecified, uncomplicated: Secondary | ICD-10-CM

## 2019-11-09 MED ORDER — VARENICLINE TARTRATE 1 MG PO TABS: ORAL_TABLET | ORAL | 5 refills | Status: AC

## 2019-11-09 MED ORDER — VARENICLINE TARTRATE 1 MG PO TABS: ORAL_TABLET | ORAL | 5 refills | Status: DC

## 2019-11-09 NOTE — Telephone Encounter (Signed)
Please resend varenicline (CHANTIX) 1 MG tablet

## 2019-11-09 NOTE — Telephone Encounter (Signed)
Looks like he sees you on 11/5. Do you want me to go ahead and resend the chantix that you sent in on 10/8? He reported not taking on 10/25

## 2019-11-09 NOTE — Progress Notes (Signed)
Refill sent.

## 2019-11-13 ENCOUNTER — Telehealth: Payer: Self-pay | Admitting: Pharmacist

## 2019-11-13 ENCOUNTER — Telehealth: Payer: BC Managed Care – PPO

## 2019-11-13 NOTE — Progress Notes (Signed)
  Chronic Care Management   Note  11/13/2019 Name: Daris Harkins MRN: [id number] DOB: 01/11/1950   Attempted to contact patient for scheduled appointment for medication management support. Left HIPAA compliant message for patient to return my call at their convenience.    Plan: - If I do not hear back from the patient by end of business today, will collaborate with Care Guide to outreach to schedule follow up with me   Catie Feliz Beam, PharmD, [place], CPP Clinical Pharmacist [facility] [employer] [phone]

## 2019-11-20 NOTE — Telephone Encounter (Signed)
No Answer. Unable to leave VM X3, Closing encounter.

## 2019-12-01 ENCOUNTER — Other Ambulatory Visit: Payer: BC Managed Care – PPO

## 2019-12-02 ENCOUNTER — Telehealth: Payer: Self-pay

## 2019-12-02 MED ORDER — AMLODIPINE BESYLATE 5 MG PO TABS: 10.0000 mg | ORAL_TABLET | Freq: Every day | ORAL | 1 refills | Status: AC

## 2019-12-02 NOTE — Telephone Encounter (Signed)
Pt needs refill on amlodipine sent to [facility].

## 2019-12-11 ENCOUNTER — Ambulatory Visit: Payer: BC Managed Care – PPO

## 2021-05-10 ENCOUNTER — Telehealth: Payer: Self-pay | Admitting: Cardiology

## 2021-05-10 NOTE — Telephone Encounter (Signed)
Have made several attempts to schedule with no success Deleted recall 

## 2021-05-11 NOTE — Telephone Encounter (Signed)
Noted  

## 2021-07-01 IMAGING — CT CT HEAD W/O CM
3 series · 16 of 47 positions shown, 19 images · non-contrast
Comparison: None.

CLINICAL DATA: Dizziness.

EXAM:
CT HEAD WITHOUT CONTRAST
TECHNIQUE: Contiguous axial images were obtained from the base of the skull
through the vertex without intravenous contrast.

[Series 2: head wo · axial · 0.46mm/px · z∈[-135,-0]mm · 10 of 33 slices shown, 13 images]
[im 3/33  brain]
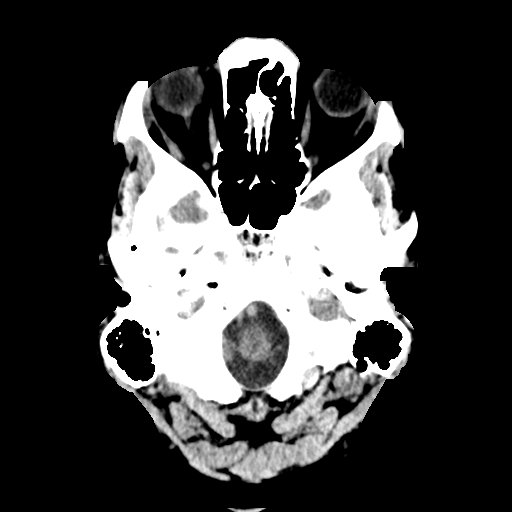
[im 3/33  bone]
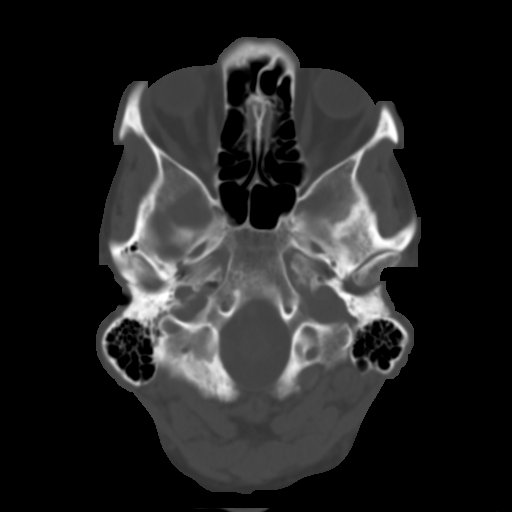
[im 6/33  brain]
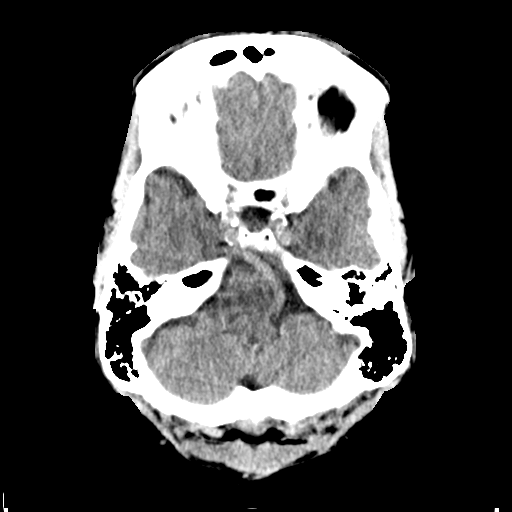
[im 9/33  brain]
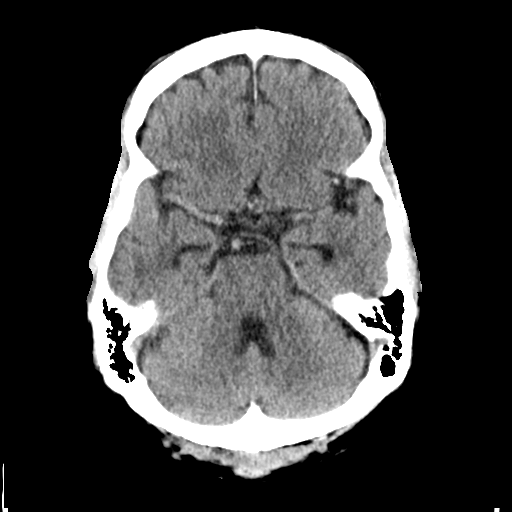
[im 12/33  brain]
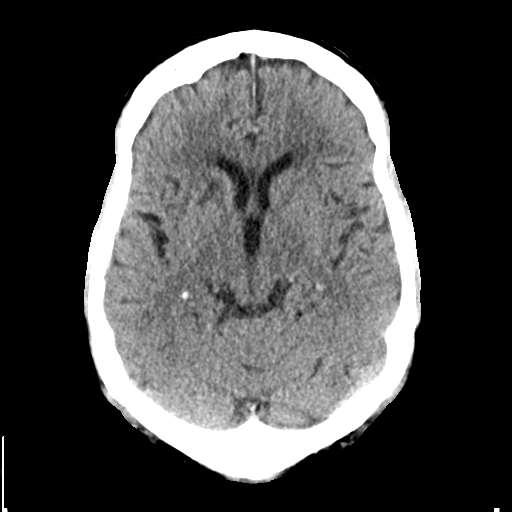
[im 15/33  brain]
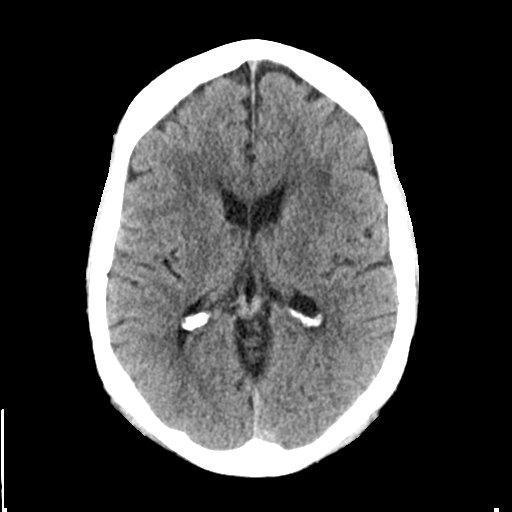
[im 15/33  bone]
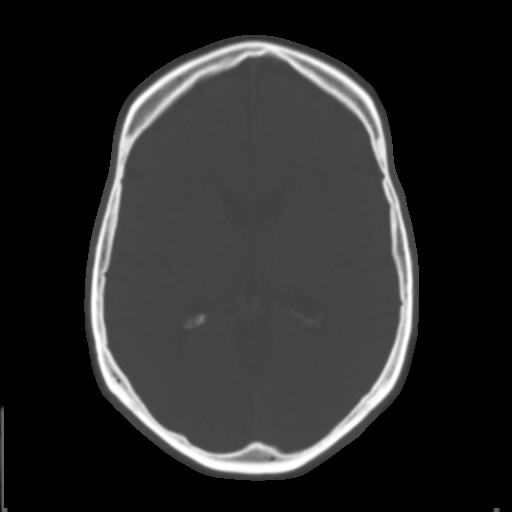
[im 18/33  brain]
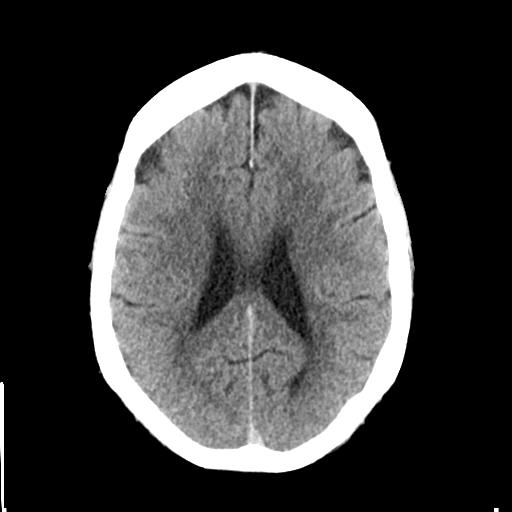
[im 21/33  brain]
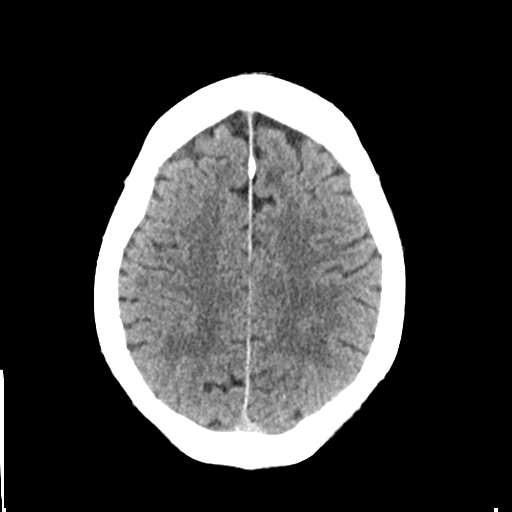
[im 25/33  brain]
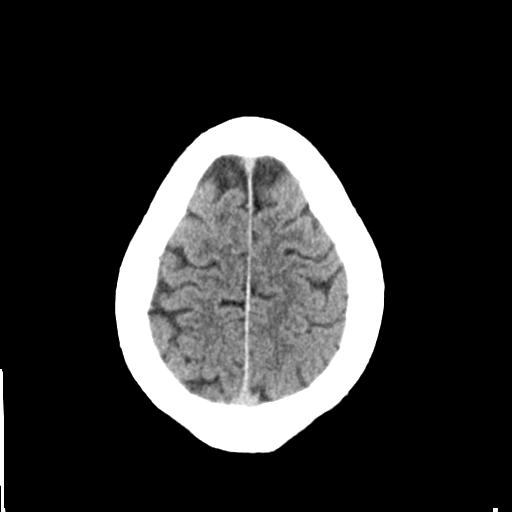
[im 27/33  brain]
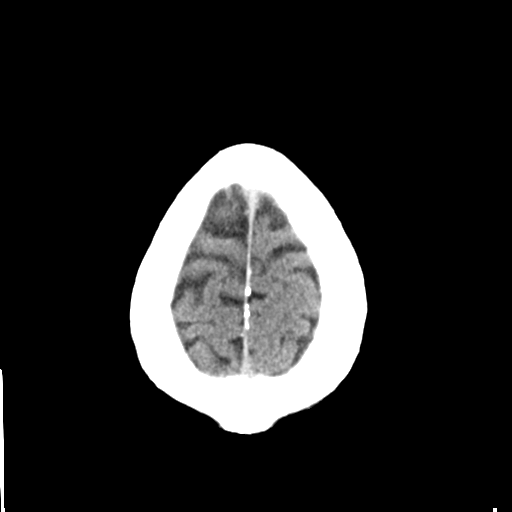
[im 27/33  bone]
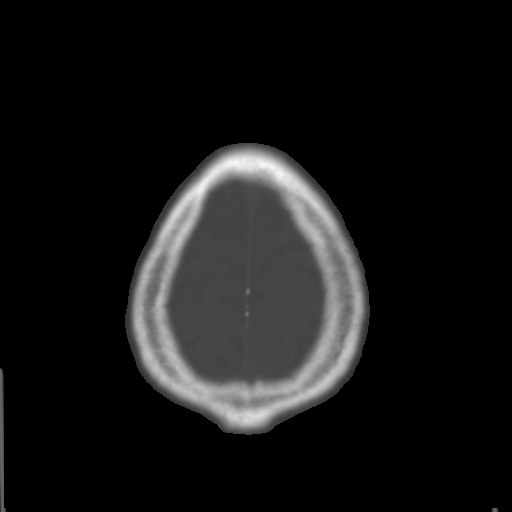
[im 30/33  brain]
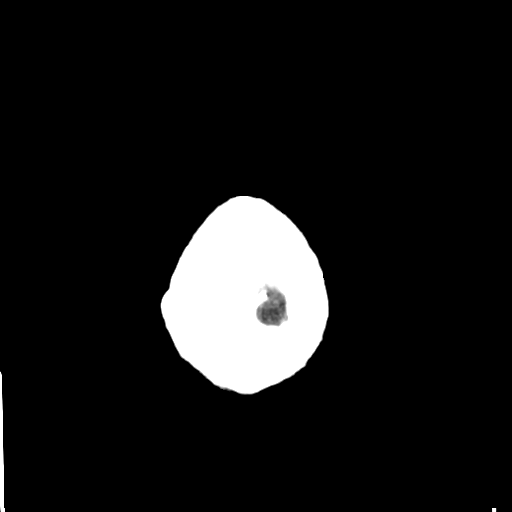

[Series 4: coronal soft tissue · coronal · 0.35mm/px · 3 of 74 slices shown]
[im 25/74  brain]
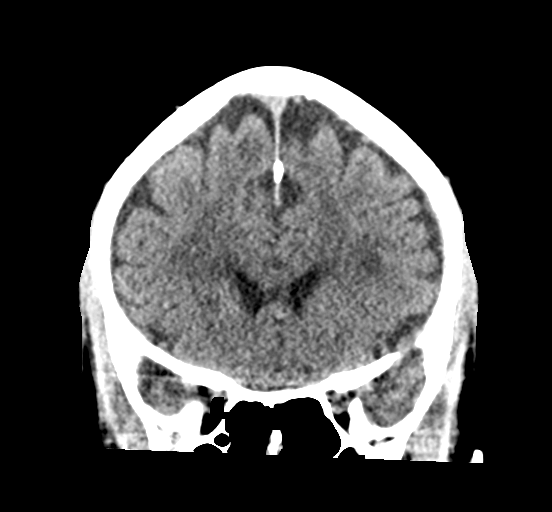
[im 33/74  brain]
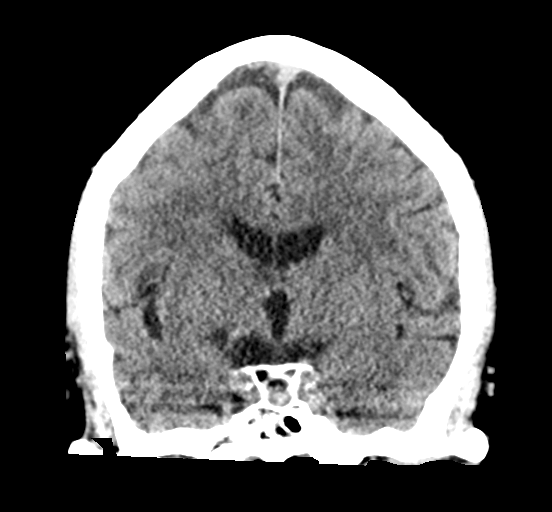
[im 41/74  brain]
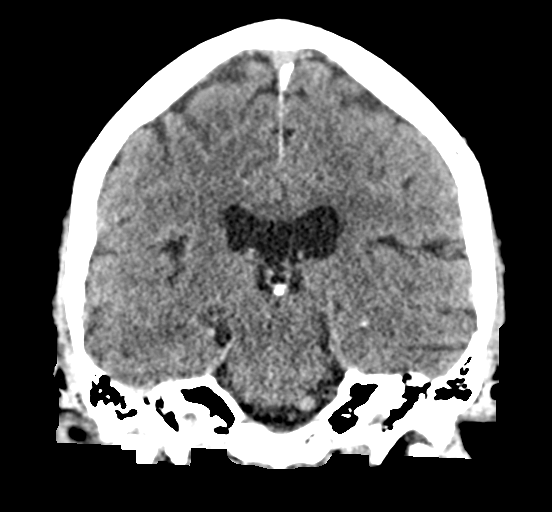

[Series 5: sagittal soft tissue · sagittal · 0.35mm/px · 3 of 57 slices shown]
[im 19/57  brain]
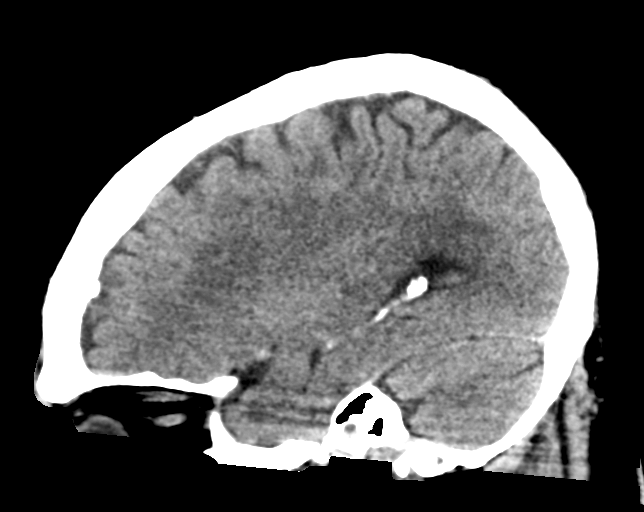
[im 29/57  brain]
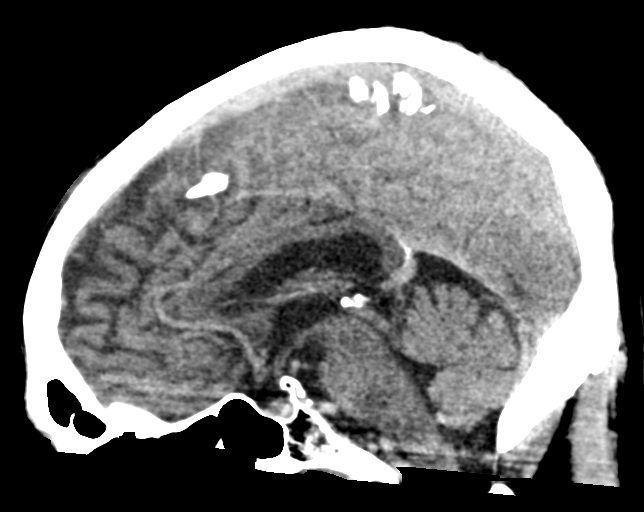
[im 38/57  brain]
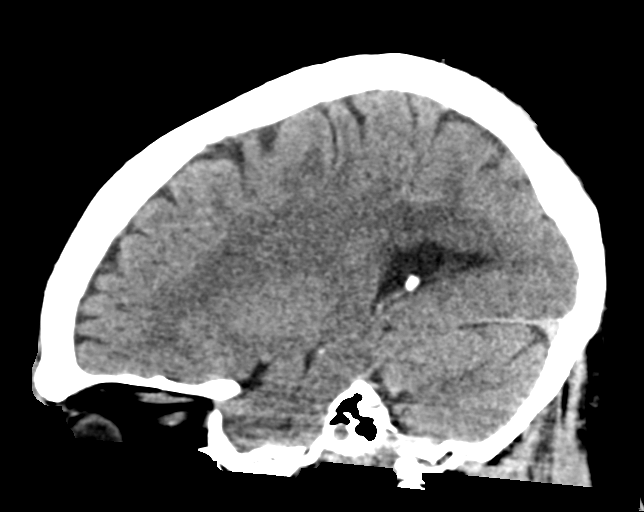

[16 of 47 positions shown; findings below may reference images not displayed]

FINDINGS: Brain: Sequela of remote right caudate infarct. No acute infarct. No
mass lesion. No midline shift, ventriculomegaly or extra-axial fluid
collection.

Vascular: No hyperdense vessel. Bilateral carotid siphon
atherosclerotic calcifications.

Skull: Negative for fracture or focal lesion.

Sinuses/Orbits: Normal orbits. Clear paranasal sinuses. No mastoid
effusion.

Other: None.
IMPRESSION: No acute intracranial process.

Remote right caudate infarct.
